# Patient Record
Sex: Female | Born: 1980 | Race: White | Hispanic: No | Marital: Married | Smoking: Never smoker
Health system: Southern US, Community
[De-identification: ages and names within clinical notes are randomized; demographics above are authoritative.]

## PROBLEM LIST (undated history)

## (undated) DIAGNOSIS — G43909 Migraine, unspecified, not intractable, without status migrainosus: Secondary | ICD-10-CM

## (undated) DIAGNOSIS — L719 Rosacea, unspecified: Secondary | ICD-10-CM

## (undated) HISTORY — DX: Migraine, unspecified, not intractable, without status migrainosus: G43.909

## (undated) HISTORY — DX: Rosacea, unspecified: L71.9

---

## 2000-03-14 ENCOUNTER — Other Ambulatory Visit: Admission: RE | Admit: 2000-03-14 | Discharge: 2000-03-14 | Payer: Self-pay | Admitting: *Deleted

## 2001-03-19 ENCOUNTER — Other Ambulatory Visit: Admission: RE | Admit: 2001-03-19 | Discharge: 2001-03-19 | Payer: Self-pay | Admitting: *Deleted

## 2002-04-10 ENCOUNTER — Other Ambulatory Visit: Admission: RE | Admit: 2002-04-10 | Discharge: 2002-04-10 | Payer: Self-pay | Admitting: Obstetrics and Gynecology

## 2003-04-23 ENCOUNTER — Other Ambulatory Visit: Admission: RE | Admit: 2003-04-23 | Discharge: 2003-04-23 | Payer: Self-pay | Admitting: Obstetrics and Gynecology

## 2004-05-03 ENCOUNTER — Other Ambulatory Visit: Admission: RE | Admit: 2004-05-03 | Discharge: 2004-05-03 | Payer: Self-pay | Admitting: Obstetrics and Gynecology

## 2005-06-23 ENCOUNTER — Other Ambulatory Visit: Admission: RE | Admit: 2005-06-23 | Discharge: 2005-06-23 | Payer: Self-pay | Admitting: Obstetrics and Gynecology

## 2005-11-28 HISTORY — PX: GALLBLADDER SURGERY: SHX652

## 2005-12-07 ENCOUNTER — Inpatient Hospital Stay (HOSPITAL_COMMUNITY): Admission: AD | Admit: 2005-12-07 | Discharge: 2005-12-07 | Payer: Self-pay | Admitting: Obstetrics and Gynecology

## 2006-05-24 ENCOUNTER — Inpatient Hospital Stay (HOSPITAL_COMMUNITY): Admission: AD | Admit: 2006-05-24 | Discharge: 2006-05-31 | Payer: Self-pay | Admitting: Obstetrics and Gynecology

## 2006-05-24 ENCOUNTER — Encounter (INDEPENDENT_AMBULATORY_CARE_PROVIDER_SITE_OTHER): Payer: Self-pay | Admitting: *Deleted

## 2006-06-21 ENCOUNTER — Emergency Department (HOSPITAL_COMMUNITY): Admission: EM | Admit: 2006-06-21 | Discharge: 2006-06-21 | Payer: Self-pay | Admitting: Emergency Medicine

## 2006-06-21 ENCOUNTER — Inpatient Hospital Stay (HOSPITAL_COMMUNITY): Admission: EM | Admit: 2006-06-21 | Discharge: 2006-06-26 | Payer: Self-pay | Admitting: *Deleted

## 2006-06-22 ENCOUNTER — Encounter (INDEPENDENT_AMBULATORY_CARE_PROVIDER_SITE_OTHER): Payer: Self-pay | Admitting: *Deleted

## 2009-06-19 ENCOUNTER — Other Ambulatory Visit: Admission: RE | Admit: 2009-06-19 | Discharge: 2009-06-19 | Payer: Self-pay | Admitting: Family Medicine

## 2011-04-15 NOTE — H&P (Signed)
NAMESEHAM, GARDENHIRE                 ACCOUNT NO.:  [id number]   MEDICAL RECORD NO.:  [id number]          PATIENT TYPE:  INP   LOCATION:  NA                            [facility]ILITY:  [facility]   PHYSICIAN:  Duke Salvia. Marcelle Overlie, M.D.DATE OF BIRTH:  1980/12/21   DATE OF ADMISSION:  DATE OF DISCHARGE:                                HISTORY & PHYSICAL   SCHEDULED DATE OF CESAREAN:  May 30, 2006   CHIEF COMPLAINT:  Breech presentation at term.   HISTORY OF PRESENT ILLNESS:  A 30 year old G1, P0 with EDD June 06, 2006,  presents with persistent breech presentation for primary cesarean section.  This procedure including risks of bleeding, infection, transfusion, adjacent  organ injury, and other risks related to bleeding, infection, phlebitis,  along with her expected recovery time discussed with her.   Blood type is O positive, rubella titer is immune, GBS was negative, 1-hour  GTT was normal.   PAST MEDICAL HISTORY:  History of migraine headache.   ALLERGIES:  1.  PENICILLIN.  2.  SULFA.   For the remainder of her family history please see her [facility] form.   PHYSICAL EXAMINATION:  VITAL SIGNS:  Temperature 98.2, blood pressure  128/80.  HEENT:  Unremarkable.  NECK:  Supple without masses.Marland Kitchen  LUNGS:  Clear.  CARDIOVASCULAR:  Regular rate and rhythm without murmurs, rubs, or gallops  noted.  BREASTS:  Not examined.  ABDOMEN:  Term fundal height, breech by Leopold's.  Fetal heart rate 140.  PELVIC:  Cervix is closed.  EXTREMITIES AND NEUROLOGIC:  Unremarkable.   IMPRESSION:  Breech presentation at term.   PLAN:  Primary cesarean section.  Procedure and risks reviewed as above.     Richard M. Marcelle Overlie, M.D.  Electronically Signed    RMH/MEDQ  D:  05/23/2006  T:  05/23/2006  Job:  [id number]

## 2011-04-15 NOTE — Op Note (Signed)
NAMEMELLISA, [name] [name]                 ACCOUNT NO.:  [id number]   MEDICAL RECORD NO.:  [id number]          PATIENT TYPE:  INP   LOCATION:  5006                         FACILITY:  [facility]   PHYSICIAN:  Sandria Bales. Ezzard Standing, M.D.  DATE OF BIRTH:  1981-09-11   DATE OF PROCEDURE:  06/22/2006  DATE OF DISCHARGE:                                 OPERATIVE REPORT   PREOPERATIVE DIAGNOSIS:  1.  Cholecystitis.  2.  Cholelithiasis.   POSTOPERATIVE DIAGNOSIS:  1.  Cholecystitis.  2.  Cholelithiasis.  3.  Probable distal common bile duct stone.  4.  Multiple intraabdominal adhesions.   PROCEDURE:  Laparoscopic cholecystectomy with intraoperative cholangiogram  and common duct manipulation with a #4 Fogarty catheter.   SURGEON:  Dr. Ovidio Kin.   FIRST ASSISTANT:  Dr. Violeta Gelinas.   ANESTHESIA:  General endotracheal with approximately 15 - 20 cc of 0.25%  Marcaine.   HISTORY:  Ms. Ludwick is a 30 year old white female who is approximately 1-  month postpartum from her first child.  She developed acute abdominal pain  and was seen by Dr. Jaclynn Guarneri earlier this morning and was admitted with  cholecystitis.  Of note, she had a c-section about a month ago, but  apparently had some kind of post op complication with infection in which  she was hospitalized for about a week but has gotten over this.  And now  there is an acute episode she has what looks like multiple gallstones on her  ultrasound.   The indications and potential complications were explained to the patient.  Potential complications include but are not limited to bleeding, infection,  bile duct injury and possibility of open surgery.   The patient now comes for attempted laparoscopic cholecystectomy.  She was  on Ancef as an antibiotic.   OPERATIVE NOTE:  Patient in the supine position with her abdomen prepped  with Betadine solution and sterilely draped.  An infraumbilical incision was  made with sharp dissection carried down  to the abdominal cavity.  A 10 mm, 0  degrees laparoscope was inserted through a 12 mm Hassan trocar.  The [facility]  trocar was secured with a 0 Vicryl suture.   I then placed a 10 mm trocar in the subxyphoid location, a 5 mm trocar on  the right mid subcostal and a 5 mm trocar at the right lateral subcostal  location.  I did an abdominal exploration.  What was remarkable was that she  had extensive intraabdominal adhesions.  I took a picture of these.  These  adhesions appeared to be located attaching to the right lobe of the liver.  They involved a lot of the right colon and small bowel on the right side and  then around her uterus.  I used scissors to kind of cut a lot of these  adhesions.  I assume this neovascular pattern on the peritoneal surface is  due to the post C section uterine infection she had.   I then turned my attention to the gallbladder which was acutely distended.  The gallbladder was decompressed.  Sharp dissection  was carried out all  along the cystic duct and the gallbladder junction.  There was a fairly  short cystic duct, probably no more than a 1-1/2 cm in length, and I  obtained an intraoperative cholangiogram.   After identified the triangle of Calot and making sure I had the cystic duct  isolated enough, I placed a cut Taut catheter inserted through the 14-gauge  gel cut into the side of the cut to the cystic duct.   The Taut catheter was secured with an Endoclip, and the intraoperative  cholangiogram showed free flow of contrast down the common bile duct.  It  showed up all the hepatic radicals which clearly showed back the areas of  obstruction distally, and it never did empty from the distal common bile  duct.  I never saw any obvious stones, however, of the duct.   We then gave the patient 1 mg of glucagon, I passed the #4 Fogarty catheter  down the cystic duct into the common bile duct.  I never thought that I was  able to pass that Fogarty catheter  into the duodenum.  I blew it up twice.  I used about a total of about 20 mL of half-strength Hypaque solution.  The  repeat cholangiogram still showed a tapering of the distal common bile duct  but no emptying into the duodenum.  There was no clear meniscus sign,  however, the common duct injection was under pressure.  So I placed an  Endoloop around the cystic duct because the cystic duct was actually fairly  broad, probably 1 cm to 1-1/2 cm broad, and I placed a clip proximal to the  Endoloop.   The gallbladder was then sharply and bluntly dissected from the gallbladder  bed.  Prior to complete division of the gall bladder from the gallbladder  bed, I revisualized the triangle of Calot.  I revisualized the gallbladder  bed.  There was no bleeding or bile leak, and I put the gallbladder in an  EndoCatch bag and delivered it through the umbilicus.   I irrigated the abdomen with about 2 L of saline, reinspected and saw, again  I had taken down a lot of these adhesions but saw no bleeding from the  adhesions.  I do think the patient will be best served with a GI consult and  possibly ERCP post op, and I think that she probably has distal common bile  duct stone.  But I did not see any obvious stone defect on our  cholangiogram.   She tolerated the procedure well.  The umbilical port was closed with a 0  Vicryl suture.  Each trocar was removed under turn, and there was no  bleeding at the trocar sites.  The skin was closed with a 5-0 Vicryl,  painted with a tincture of benzoin and Steri-Strips.  The sponge and needle  counts were correct.      Sandria Bales. Ezzard Standing, M.D.  Electronically Signed     DHN/MEDQ  D:  06/22/2006  T:  06/23/2006  Job:  [id number]   cc:   Duke Salvia. Marcelle Overlie, M.D.  Fax: [phone]   Llana Aliment. Malon Kindle., M.D.  Fax: [phone]

## 2011-04-15 NOTE — Discharge Summary (Signed)
[name] [name]                 ACCOUNT NO.:  [id number]   MEDICAL RECORD NO.:  [id number]          [name] TYPE:  INP   LOCATION:  5006                         FACILITY:  [facility]   PHYSICIAN:  Revonda Standard L. Rennis Harding, N.P. DATE OF BIRTH:  1981-01-29   DATE OF ADMISSION:  06/21/2006  DATE OF DISCHARGE:  06/26/2006                                 DISCHARGE SUMMARY   CHIEF COMPLAINT/REASON FOR ADMISSION:  [name] [name] is a 30 year old female  [name] who presented with severe epigastric pain.  She presented to the ER  and workup there revealed evidence of acute cholecystitis i.e. gallstones.  She had a white count of 3,700 and mild elevation of transaminases.  SGOT  228, SGPT 83, lipase was normal.  Common duct appeared normal on ultrasound.  The [name] was admitted with a diagnosis of acute cholecystitis.   HOSPITAL COURSE:  The [name] was admitted and taken from the emergency  department to the OR where she underwent laparoscopic cholecystectomy with  intraoperative cholangiogram.  Her preoperative diagnosis was cholecystitis  and cholelithiasis.  Her postoperative cholecystitis and cholelithiasis with  a probable distal common bile duct stone based on the intraoperative  cholangiogram.  She also was found to have multiple abdominal adhesions  which were taken down in the OR by Dr. Ezzard Standing.   Because of the finding of possible common bile duct stone on intraoperative  cholangiogram, Dr. Luxembourg was consultant.  They opted to proceed with an MRCP  initially because he was not convinced he saw common bile duct stone.  MRCP  was unrevealing.  On postoperative day #1 the [name] had significant  elevation in her transaminases with AST rising to 297, ALT to 470 and total  bilirubin up to 3.8 which previously had been normal at 1.0.  The [name]  was nauseous and had mild periumbilical tenderness at the surgical sites.   On June 24, 2006 the [name] did undergo an ERCP, no stones were seen but  a  sphincterotomy was done.  The following day, the [name] still demonstrated  mild transaminases but the enzymes were trending downward.  The [name]'s  diet was advanced and GI wanted to watch her for an additional 24 hours to  make sure her transaminases were further resolving and she had no issues  with advancement of diet.   By June 26, 2006 which is postoperative day #4 the [name] was tolerating a  regular diet.  Her AST had decreased to 70,  ALT 209 and total bilirubin is  down to 1.5.  Her abdomen was soft and benign and from a surgical standpoint  she was deemed appropriate for discharge.  At the time of dictation of  discharge summary still awaiting GI evaluation.  They will allow the [name]  to discharge home and followup with them per their recommendations.   FINAL DISCHARGE DIAGNOSES:  1.  Acute cholecystitis with cholelithiasis.  2.  ERCP without evidence of common bile duct stones, associated      sphincterotomy done.  3.  Mild cholangitis which is now resolving.   DISCHARGE MEDICATIONS:  1.  Resume any home medications.  2.  Vicodin 5/500 one to two tablets every 6 hours as needed for pain #30      dispensed with no refills.   Return to work after seen by Dr. Ezzard Standing.  Diet no restrictions.  Activity  increase activity slowly.  May shower.  No driving for 1 week, no lifting  for 2 weeks.  Allow Steri-Strips to fall off.   FOLLOWUP:  You need to call Dr. Ezzard Standing at [phone] to be seen in 2 weeks.  You need to followup with GI for further recommendations.   ADDITIONAL INSTRUCTIONS:  You need to call the doctor if fever more than  100.5 degrees by mouth, new or increased belly pain, nausea or vomiting,  redness or drainage at the wound.      Allison L. Rennis Harding, N.P.     ALE/MEDQ  D:  06/26/2006  T:  06/26/2006  Job:  [id number]   cc:   Sandria Bales. Ezzard Standing, M.D.  [address]  [place]  [place] [address]

## 2011-04-15 NOTE — Op Note (Signed)
[name] [name], [name] [name]                 ACCOUNT NO.:  [id number]   MEDICAL RECORD NO.:  [id number]          PATIENT TYPE:  INP   LOCATION:  9127                          [facility]   PHYSICIAN:  Michelle L. Grewal, M.D.DATE OF BIRTH:  03-24-1981   DATE OF PROCEDURE:  05/24/2006  DATE OF DISCHARGE:                                 OPERATIVE REPORT   PREOPERATIVE DIAGNOSES:  1.  Intrauterine pregnancy at term.  2.  Breech presentation.  3.  Labor.   POSTOPERATIVE DIAGNOSES:  1.  Intrauterine pregnancy at term.  2.  Breech presentation.  3.  Labor.  4.  Chorioamnionitis.   PROCEDURE:  Primary low transverse cesarean section.   SURGEON:  Michelle L. Vincente Poli, M.D.   ANESTHESIA:  Spinal.   FINDINGS:  Female infant, Apgar 1 at one minute and 9 at 5 minutes. Tight  nuchal cord x3.  Birth weight 6 pounds 11 ounces.  In double footling breech  presentation.  Frank pus noted in the uterus, and appendix was normal.   DISPOSITION TO PATHOLOGY:  Placenta to pathology.   DRAINS:  Foley.   ESTIMATED BLOOD LOSS:  500 cc.   COMPLICATIONS:  None.   PROCEDURE:  The patient was taken to the operating room, where a spinal was  placed by Dr. Malen Gauze. She was then prepped and draped in the usual sterile  fashion, and a Foley catheter was inserted. The spinal was tested and was  found to be adequate.  A low transverse incision was made in the skin and  carried down to the fascia.  The fascia was scored in the midline, and this  was extended laterally.  The rectus muscles were separated in the midline,  and the peritoneum was entered bluntly.  The peritoneal incision was then  stretched.  The bladder blade was inserted. The lower uterine segment  identified, and the bladder flap was created sharply and then digitally.  The bladder blade was adjusted. A low transverse incision was made in the  uterus.  The uterus was entered using a hemostat.  Amniotic fluid was clear,  but upon entry into the  uterine cavity an obvious over was emitted. The baby  was in double footling breech. As we were doing a complete breech  extraction, I noticed frank pus adherent to the baby's buttocks, consistent  with probable chorioamnionitis.  The baby was in double footling breech,  with a tight nuchal cord x3.  We did have to rotate the baby just a little  bit to deliver completely. The baby was very floppy immediately after  delivery, but after the cord was clamped and cut the baby was then taken to  the awaiting neonatal team, and Apgars were 1 at one minute and 9 at five  minutes. The uterus was then exteriorized, and the placenta was manually  removed and noted to be normal and intact.  It was sent to pathology for  analysis.  The uterine cavity with warm inside, and it was cleared of all  clots and debris.  The uterine incision was closed in one layer  using 0  chromic in a continuous running-locked stitch.  I do think the patient  clinically had chorioamnionitis. Membranes were intact at the time of  delivery. The uterus was returned to the abdomen.  Copious irrigation was  performed.  There was no other identifiable pus in the abdomen.  The  appendix was easily visualized and was noted be long, completely normal. and  not inflamed.  The incision was reinspected and noted to be normal and  hemostatic.  The peritoneum was closed using 0 Vicryl, and the rectus  muscles were reapproximated using the same 0 Vicryl.  The fascia was closed  using 0 Vicryl starting at each corner and meeting in the midline. After  irrigation of the subcutaneous layer, the skin was closed with staples.  All  sponge, lap, and instrument counts were correct x2.  The patient went to the  recovery room in stable condition.  She will be given IV clindamycin and  gentamicin postop, because I think she is at higher risk for a wound  infection because of the chorioamnionitis.      Michelle L. Vincente Poli, M.D.  Electronically  Signed     MLG/MEDQ  D:  05/24/2006  T:  05/24/2006  Job:  [id number]

## 2011-04-15 NOTE — Discharge Summary (Signed)
[name] [name], [name] [name]                 ACCOUNT NO.:  [id number]   MEDICAL RECORD NO.:  [id number]          PATIENT TYPE:  INP   LOCATION:  9127                          [facility]   PHYSICIAN:  Michelle L. Grewal, M.D.DATE OF BIRTH:  [birth year]   DATE OF ADMISSION:  05/24/2006  DATE OF DISCHARGE:  05/31/2006                                 DISCHARGE SUMMARY   ADMITTING DIAGNOSES:  1. Intrauterine pregnancy at term.  2. Breech presentation.  3. Spontaneous onset of labor.   DISCHARGE DIAGNOSES:  1. Status post low transverse cesarean section.  2. Viable female infant.  3. Severe chorioamnionitis.   PROCEDURE:  Primary low transverse cesarean section.   REASON FOR ADMISSION:  Please see written H&P.   HOSPITAL COURSE:  Patient is 24-year primigravida that was admitted to  [facility] in active labor.  Patient had a known breech  presentation and had been previously scheduled for cesarean delivery.  Due  to onset of labor with known footling breech decision was made to proceed  with a primary low transverse cesarean section.  The patient was then taken  to the operating room where spinal anesthesia was administered without  difficulty.  A low transverse incision was made with the delivery of a  viable female infant in the double footling breech presentation.  As baby  was being delivered frank pus was noted to be adherent to baby buttocks  consistent with probable chorioamnionitis.  Baby was also noted to have a  tight nuchal cord x3.  Baby was immediately very floppy at the time of  delivery but after the cord was clamped baby was taken to the waiting  neonatal team and Apgars were 1 at one minute and 9 at five minutes.  The  uterus was exteriorized and the placenta was manually removed and noted to  be normal and intact.  It was sent for pathology for analysis.  The uterine  cavity was cleared of all follow clots and debris and the incision was  closed.   Patient was started on IV clindamycin, gentamicin postoperatively  due to thought for probable chorioamnionitis.  Patient was then taken to the  recovery room in stable condition.  On postoperative day one patient was  without complaint.  She was tolerating clear liquids.  Vital signs were  stable with temperature max of 100.9.  Fundus was firm and nontender.  Abdominal dressing was clean, dry, and intact.  Laboratory findings revealed  hemoglobin of 11.2, platelet count 146,000, WBC count of 21.3.  On  postoperative day two patient was without complaint.  Vital signs were  stable.  She was afebrile for over 24 hours.  Abdomen was slightly distended  with minimal bowel sounds.  Fundus was firm __________, however, slightly  tender to palpation.  Incision was clean, dry, and intact.  Patient  continued on antibiotics and was given warm beverages and Colace.  Shortly  after midnight patient did spike temperature of 102, blood pressure 117/78,  pulse was 135.  Patient did rate pain level 7-8.  Physical was contacted.  CBC  was ordered.  IV fluid bolus was also given.  Patient was followed  closely through the night with temperature varying from 97.9-100, blood  pressure 121/82 to 129/83.  On the following morning vital signs were stable  with temperature max of 102 during the night.  However was 97.9 at the time  of rounding.  Abdomen was moderately distended, soft with minimal bowel  sounds.  Patient, however, had tolerated a regular diet.  Patient was  administered Dulcolax suppository and a CBC was ordered for the following  morning.  Later that evening patient had complained of some increase in  discomfort in the bilateral lower abdomen.  She has experienced some watery  diarrhea after the Dulcolax suppository.  Patient did have a flat plate of  the abdomen which did reveal consistent with ileus without obstruction.  Chest x-ray had noted some atelectasis.  PIH labs were within normal  limits.  Electrolytes normal.  CBC revealed hemoglobin of 11.7 and WBC count down to  14.7.  Patient continued on clindamycin and gentamicin.  Patient now was due  to ileus.  Diet was discontinued.  The patient was only given ice chips.  She was now was given a PCA pump.  On the following morning patient was  feeling somewhat better.  She was having better control of her pain with the  morphine pump.  Vital signs were stable with pulse rate down in the 90s-  100s.  She was beginning to have some diuresis.  Lungs were clear to  auscultation.  Abdomen was slightly distended, however, somewhat softer.  WBC count was 14.1.  On the following morning patient began to complain of  some pain in the lower abdomen, no nausea.  Temperature max was 101.2.  Abdomen continued to be slightly distended.  Laboratory findings revealed  hemoglobin of 11.6, WBC count of 15.5.  Electrolytes were within normal  limits.  Creatinine 1.  CT scan was ordered for the abdomen.  Later report  was called to physician which revealed no obstruction.  Patient was now  given a Fleet's enema.  On the following morning patient had had good  results from enema.  Vital signs were stable.  Temperature now afebrile.  Abdomen to be slightly distended, however, good bowel sounds.  She continued  on clear liquids at this current time and antibiotic therapy.  On the  following morning patient had had good results with the enema.  She was  having chest discomfort.  She was tolerating clear liquids and really  desired discharge.  Vital signs were stable.  She was afebrile.  Abdomen  continued to be slightly distended.  Incision was clean, dry, and intact.  She was administered a regular diet and if patient tolerated discharge  instructions reviewed and patient was later discharged home.   CONDITION ON DISCHARGE:  Stable.   DIET:  Regular, as tolerated.  ACTIVITY:  No heavy lifting, no driving x2 weeks, no vaginal entry.    FOLLOW-UP:  Patient is to follow up in the office in two to three days for  incision check and she is to call for temperature greater than 100 degrees,  persistent nausea, vomiting, heavy vaginal bleeding, and/or redness or  drainage from the incisional site.   DISCHARGE MEDICATIONS:  1. Tylox #30 one p.o. every four to six hours p.r.n.  2. Prenatal vitamins as tolerated.      Julio Sicks, N.P.      Stann Mainland. Vincente Poli, M.D.  Electronically Signed  CC/MEDQ  D:  07/19/2006  T:  07/19/2006  Job:  [id number]

## 2011-04-15 NOTE — Consult Note (Signed)
NAMEARYONA, SILL NO.:  [id number]   MEDICAL RECORD NO.:  [id number]          PATIENT TYPE:  INP   LOCATION:  5006                         FACILITY:  [facility]   PHYSICIAN:  Graylin Shiver, M.D.   DATE OF BIRTH:  20-Jan-1981   DATE OF CONSULTATION:  06/22/2006  DATE OF DISCHARGE:                                   CONSULTATION   REASON FOR CONSULTATION:  The patient is a 30 year old Caucasian female,  status post laparoscopic cholecystectomy for gallstones a few hours ago.  We  were asked to see the patient in regards to possible common bile duct stone.  The patient is postop, she is sleeping, she wakes periodically, but falls  back asleep.  Her mother is present during the interview.  She has no  specific complaints at this time.   PHYSICAL:  VITAL SIGNS:  Stable.  HEART:  Regular rhythm.  She is nonicteric.  Lungs were clear.  Abdomen is postop.   The operative cholangiogram was reviewed tonight with Dr. Bridgett Larsson from  radiology.  No obvious stone is seen in the biliary tree, but there was no  contrast which we could see enter into the duodenum.  Question would arise  as to whether this could be spasm versus a small stone which is impacted in  the ampullary area.   Yesterday her liver enzymes revealed a total bilirubin of 1, alkaline  phosphatase 107, AST 228, ALT 83.   IMPRESSION:  18.  A 30 year old female status post laparoscopic cholecystectomy.  2.  Rule out common bile duct stone.   PLAN:  At the present time I would not go directly to ERCP, in view of the  soft findings and potential risk of the procedure, which would include  bleeding, infection, perforation and pancreatitis.  I would favor  observation, rechecking her liver enzymes, and doing an MRCP to try to  further clarify this situation.           ______________________________  Graylin Shiver, M.D.     SFG/MEDQ  D:  06/22/2006  T:  06/22/2006  Job:  [id number]   cc:   Sandria Bales.  Ezzard Standing, M.D.  [address], [address]  [place]  [place] [address]

## 2011-04-15 NOTE — Op Note (Signed)
[name] [name], MEDINE                 ACCOUNT NO.:  [id number]   MEDICAL RECORD NO.:  [id number]          PATIENT TYPE:  INP   LOCATION:  5006                         FACILITY:  [facility]   PHYSICIAN:  John C. Madilyn Fireman, M.D.    DATE OF BIRTH:  07-Dec-1980   DATE OF PROCEDURE:  DATE OF DISCHARGE:                                 OPERATIVE REPORT   PROCEDURE:  Endoscopic retrograde cholangiopancreatography with  sphincterotomy.   INDICATIONS FOR PROCEDURE:  I suspect a common bile duct stone on  intraoperative cholangiogram with rise in liver function test post  operatively.   PROCEDURE:  The patient was placed in the prone position and placed on pulse  monitor with continuous low flow oxygen delivered by nasal cannula.  She was  sedated with 125 mcg IV Fentanyl and 12.5 IV Versed.  Olympus eye viewing  endoscope was advanced __________ into the lower pharynx, esophagus, and  stomach.  The pylorus was traversed and the papilla of Vater located on the  medial duodenal wall.  It had a normal appearance.  It was cannulated with  Wilson-Cook sphincterotome.  Initial deep wire cannulation and dye  injections resulted in opacification of a normal appearing pancreatic duct.  With further manipulation of the wire this common bile duct was selectively  cannulated and dye injected and revealed a slightly dilated common bile duct  with intrahepatic dilatation.  No obvious filling defect.  A large  sphincterotomy was made and 3 balloon sweeps were made with the 12 mm  balloon but no stone seen to be delivered.  At the end of the last sweep an  occlusion cholangiogram was obtained and the photograph saved and showed no  obvious filling defects.  There was what appeared to be good drainage to the  dye after the sphincterotomy.  The scope was then withdrawn and the patient  returned to the recovery in stable condition.  She tolerated the procedure  well and there were no immediate complications.   IMPRESSION:   Slightly dilated common bile duct but otherwise normal  cholangiogram.  Status post empiric sphincterotomy to cover for possibility  of residual common bile duct calculi.   PLAN:  Advance diet and recheck liver function test in the morning.           ______________________________  Everardo All. Madilyn Fireman, M.D.     JCH/MEDQ  D:  06/24/2006  T:  06/25/2006  Job:  [id number]

## 2011-04-15 NOTE — H&P (Signed)
NAMESHALAYA, SWAILES                 ACCOUNT NO.:  [id number]   MEDICAL RECORD NO.:  [id number]          PATIENT TYPE:  EMS   LOCATION:  MAJO                         FACILITY:  [facility]   PHYSICIAN:  Sharlet Salina T. Hoxworth, M.D.DATE OF BIRTH:  [birth year]   DATE OF ADMISSION:  06/21/2006  DATE OF DISCHARGE:                                HISTORY & PHYSICAL   CHIEF COMPLAINT:  Epigastric and back pain.   HISTORY OF PRESENT ILLNESS:  [name] [name] is a generally healthy 30 year old  white female who, last night, had the onset of severe epigastric pain,  pressure-like, radiating around both flanks to her back.  This was  associated with nausea and vomiting.  She was evaluated in the emergency  room early this morning and a gallbladder ultrasound showed gallstones, no  gallbladder wall thickening.  She was treated with pain and nausea  medication, improved, and was discharged home with arrangements for follow-  up.  Her pain, however, soon recurred, even more severe, again associated  with nausea and vomiting.  She now represents to the emergency room.  Her  pain has been persistent in the ER only partly relieved with medications.  She has no previous history of any similar problems prior to last night.   PAST MEDICAL HISTORY:  She is status post C-section one month ago.  No  medical or surgical illnesses or hospitalizations.   MEDICATIONS:  None.   ALLERGIES:  PENICILLIN and SULFA.   SOCIAL HISTORY:  Married.  No cigarette or alcohol use.   FAMILY HISTORY:  Negative, parents alive and well.   REVIEW OF SYSTEMS:  GENERAL:  No fever or chills.  RESPIRATORY:  No  shortness of breath, asthma, lung disease.  CARDIAC:  No chest pain,  palpitations, heart disease.  ABDOMEN:  As above.  GU: No urinary burning or  frequency.   PHYSICAL EXAMINATION:  VITAL SIGNS:  Temperature 97.6, pulse 86, respirations 20, blood pressure  120/56.  GENERAL:  She is a mildly overweight white female in no acute  stress.  SKIN:  Face is slightly flushed, warm and dry.  HEENT: No palpable mass or thyromegaly.  Sclerae nonicteric.  Nose and  oropharynx clear.  LUNGS:  Clear without wheezing or increased work of breathing.  CARDIAC:  Regular rate and rhythm, no murmurs.  No edema.  ABDOMEN: Mild epigastric tenderness.  No masses.  No organomegaly detected.  EXTREMITIES:  No deformity.  NEUROLOGIC:  Alert and oriented.  Motor and sensory exams grossly normal.   LABORATORY:  White count elevated 13.7, hemoglobin 13.2  Urinalysis  negative.  Pregnancy test negative.  LFTs show mildly elevated  transaminases, SGOT and SGPT,  228 and 83 respectively.  Alkaline  phosphatase and bilirubin normal.  Lipase 28. Ultrasound of the abdomen was  reviewed which shows gallstones, normal gallbladder wall, and common bile  duct normal.   ASSESSMENT/PLAN:  Cholelithiasis and early cholecystitis.  The patient is  being admitted for IV fluids, pain and nausea control, and will start IV  antibiotics.   PLAN:  Early laparoscopic cholecystectomy with cholangiogram.  Lorne Skeens. Hoxworth, M.D.  Electronically Signed     BTH/MEDQ  D:  06/21/2006  T:  06/21/2006  Job:  [id number]

## 2015-01-06 ENCOUNTER — Encounter: Payer: Self-pay | Admitting: Family Medicine

## 2015-01-06 ENCOUNTER — Ambulatory Visit (INDEPENDENT_AMBULATORY_CARE_PROVIDER_SITE_OTHER): Payer: 59 | Admitting: Family Medicine

## 2015-01-06 VITALS — BP 124/88 | HR 85 | Temp 98.4°F | Ht 68.25 in | Wt 183.4 lb

## 2015-01-06 DIAGNOSIS — Z0000 Encounter for general adult medical examination without abnormal findings: Secondary | ICD-10-CM

## 2015-01-06 DIAGNOSIS — G43909 Migraine, unspecified, not intractable, without status migrainosus: Secondary | ICD-10-CM | POA: Insufficient documentation

## 2015-01-06 DIAGNOSIS — G43709 Chronic migraine without aura, not intractable, without status migrainosus: Secondary | ICD-10-CM

## 2015-01-06 DIAGNOSIS — Z23 Encounter for immunization: Secondary | ICD-10-CM

## 2015-01-06 DIAGNOSIS — L719 Rosacea, unspecified: Secondary | ICD-10-CM | POA: Insufficient documentation

## 2015-01-06 NOTE — Progress Notes (Signed)
Subjective:    Patient ID: [name] [name], female    DOB: 05/14/1981, 34 y.o.   MRN: [id number]  HPI Here to establish for primary care  Lives in [place]-this is closer  Used to see Dr Wynelle LinkSun   Dr Marcelle OverlieHolland for GYN Last pap in April Has 34 year old daughter  Has an IUD -no menses    Dr Neale BurlyFreeman for headaches  (prev [facility]utch[facility]) Takes imipramine daily - 100 mg at bedtime  (for a couple of years) relpax for rescue  Also some motrin  Has migraines once per mo and tension ha a few times per week  Will have follow up next Friday   Other things tried - imitrex (pills and shots)  Has also been on elavil  Avoids caffeine  Tries to follow lifestyle guidelines  She does have trouble sleep    Dr Marian SorrowGaylia Smith for dental   Not sure when last Tdap was - in past 10 years  Wants a flu shot today   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  Past Medical History  Diagnosis Date  . Migraine    Past Surgical History  Procedure Laterality Date  . Gallbladder surgery  2007  . Cesarean section  2007   History  Substance Use Topics  . Smoking status: Never Smoker   . Smokeless tobacco: Not on file  . Alcohol Use: No   Family History  Problem Relation Age of Onset  . Cancer Maternal Grandmother    No Known Allergies No current outpatient prescriptions on file prior to visit.   No current facility-administered medications on file prior to visit.      Review of Systems Review of Systems  Constitutional: Negative for fever, appetite change, fatigue and unexpected weight change.  Eyes: Negative for pain and visual disturbance.  Respiratory: Negative for  cough and shortness of breath.   Cardiovascular: Negative for cp or palpitations    Gastrointestinal: Negative for nausea, diarrhea and constipation.  Genitourinary: Negative for urgency and frequency.  Skin: Negative for pallor and pos for flushing/ redness of face from rosacea  Neurological: Negative for weakness, light-headedness, numbness and headaches.  Hematological: Negative for adenopathy. Does not bruise/bleed easily.  Psychiatric/Behavioral: Negative for dysphoric mood. The patient is not nervous/anxious.         Objective:   Physical Exam  Constitutional: She appears well-developed and well-nourished. No distress.  overwt and well appearing   HENT:  Head: Normocephalic and atraumatic.  Right Ear: External ear normal.  Left Ear: External ear normal.  Nose: Nose normal.  Mouth/Throat: Oropharynx is clear and moist.  Eyes: Conjunctivae and EOM are normal. Pupils are equal, round, and reactive to light. Right eye exhibits no discharge. Left eye exhibits no discharge. No scleral icterus.  Neck: Normal range of motion. Neck supple. No JVD present. No thyromegaly present.  Cardiovascular: Normal rate, regular rhythm, normal heart sounds and intact distal pulses.  Exam reveals no gallop.   Pulmonary/Chest: Effort normal and breath sounds normal. No respiratory distress. She has no wheezes. She has no  rales.  Abdominal: Soft. Bowel sounds are normal. She exhibits no distension and no mass. There is no tenderness.  Musculoskeletal: She exhibits no edema or tenderness.  No acute joint changes   Lymphadenopathy:    She has no cervical adenopathy.  Neurological: She is alert. She has normal reflexes. No cranial nerve deficit. She exhibits normal muscle tone. Coordination normal.  Skin: Skin is warm and dry. No rash noted. No erythema. No pallor.  Psychiatric: She has a normal mood and affect.          Assessment & Plan:   Problem List Items Addressed This Visit       Cardiovascular and Mediastinum   Migraine    Seeing Dr Neale Burly at the [facility] Worse lately  On imipramine Plans to disc other opt for prophylaxis at appt next week Rev lifestyle change for headache       Relevant Medications   imipramine (TOFRANIL) 50 MG tablet     Musculoskeletal and Integument   Rosacea - Primary    Pt states she has tried many therapies w/o success and seen several dermatologists Disc products she uses - enc to use cetaphil to cleanse and avoid harsh detergents/scrubs and hot water Handout given        Other   Routine general medical examination at a health care facility    Reviewed health habits including diet and exercise and skin cancer prevention Reviewed appropriate screening tests for age  Also reviewed health mt list, fam hx and immunization status , as well as social and family history   Lab today for wellness Enc healthy diet and exercise       Relevant Orders   CBC with Differential/Platelet   Comprehensive metabolic panel   TSH   Lipid panel    Other Visit Diagnoses    Flu vaccine need        Relevant Orders    Flu Vaccine QUAD 36+ mos IM (Completed)

## 2015-01-06 NOTE — Progress Notes (Signed)
Pre visit review using our clinic review tool, if applicable. No additional management support is needed unless otherwise documented below in the visit note. 

## 2015-01-06 NOTE — Patient Instructions (Signed)
Labs for wellness today Flu shot today  Take care or yourself  Avoid harsh abrasive cleansers - be gentle to your skin and avoid hot water  I like cetaphil cleanser

## 2015-01-07 ENCOUNTER — Encounter: Payer: Self-pay | Admitting: Family Medicine

## 2015-01-07 LAB — COMPREHENSIVE METABOLIC PANEL
ALBUMIN: 4.4 g/dL (ref 3.5–5.2)
ALK PHOS: 70 U/L (ref 39–117)
ALT: 17 U/L (ref 0–35)
AST: 19 U/L (ref 0–37)
BUN: 13 mg/dL (ref 6–23)
CALCIUM: 9.7 mg/dL (ref 8.4–10.5)
CO2: 28 meq/L (ref 19–32)
Chloride: 103 mEq/L (ref 96–112)
Creatinine, Ser: 0.87 mg/dL (ref 0.40–1.20)
GFR: 79.38 mL/min (ref >60.00)
GLUCOSE: 93 mg/dL (ref 70–99)
POTASSIUM: 4.9 meq/L (ref 3.5–5.1)
Sodium: 137 mEq/L (ref 135–145)
Total Bilirubin: 0.5 mg/dL (ref 0.2–1.2)
Total Protein: 7.9 g/dL (ref 6.0–8.3)

## 2015-01-07 LAB — CBC WITH DIFFERENTIAL/PLATELET
BASOS ABS: 0 10*3/uL (ref 0.0–0.1)
Basophils Relative: 0.3 % (ref 0.0–3.0)
EOS ABS: 0 10*3/uL (ref 0.0–0.7)
Eosinophils Relative: 0 % (ref 0.0–5.0)
HEMATOCRIT: 40.6 % (ref 36.0–46.0)
Hemoglobin: 13.5 g/dL (ref 12.0–15.0)
LYMPHS ABS: 2.3 10*3/uL (ref 0.7–4.0)
Lymphocytes Relative: 26.3 % (ref 12.0–46.0)
MCHC: 33.2 g/dL (ref 30.0–36.0)
MCV: 86.7 fl (ref 78.0–100.0)
MONO ABS: 0.7 10*3/uL (ref 0.1–1.0)
Monocytes Relative: 8.2 % (ref 3.0–12.0)
NEUTROS PCT: 65.2 % (ref 43.0–77.0)
Neutro Abs: 5.7 10*3/uL (ref 1.4–7.7)
PLATELETS: 214 10*3/uL (ref 150.0–400.0)
RBC: 4.69 Mil/uL (ref 3.87–5.11)
RDW: 13.7 % (ref 11.5–15.5)
WBC: 8.7 10*3/uL (ref 4.0–10.5)

## 2015-01-07 LAB — LIPID PANEL
CHOL/HDL RATIO: 3
CHOLESTEROL: 132 mg/dL (ref 0–200)
HDL: 46.2 mg/dL (ref >39.00)
LDL Cholesterol: 69 mg/dL (ref 0–99)
NONHDL: 85.8
TRIGLYCERIDES: 83 mg/dL (ref 0.0–149.0)
VLDL: 16.6 mg/dL (ref 0.0–40.0)

## 2015-01-07 LAB — TSH: TSH: 7.09 u[IU]/mL — AB (ref 0.35–4.50)

## 2015-01-07 NOTE — Assessment & Plan Note (Signed)
Seeing Dr [facility]ale [facility]Freeman at the [facility] Worse lately  On imipramine Plans to disc other opt for prophylaxis at appt next week Rev lifestyle change for headache

## 2015-01-07 NOTE — Assessment & Plan Note (Signed)
Reviewed health habits including diet and exercise and skin cancer prevention Reviewed appropriate screening tests for age  Also reviewed health mt list, fam hx and immunization status , as well as social and family history   Lab today for wellness Enc healthy diet and exercise

## 2015-01-07 NOTE — Assessment & Plan Note (Signed)
Pt states she has tried many therapies w/o success and seen several dermatologists Disc products she uses - enc to use cetaphil to cleanse and avoid harsh detergents/scrubs and hot water Handout given

## 2015-01-08 ENCOUNTER — Other Ambulatory Visit (INDEPENDENT_AMBULATORY_CARE_PROVIDER_SITE_OTHER): Payer: 59

## 2015-01-08 DIAGNOSIS — R946 Abnormal results of thyroid function studies: Secondary | ICD-10-CM

## 2015-01-08 DIAGNOSIS — R7989 Other specified abnormal findings of blood chemistry: Secondary | ICD-10-CM

## 2015-01-08 LAB — T4, FREE: FREE T4: 0.78 ng/dL (ref 0.60–1.60)

## 2015-01-29 ENCOUNTER — Other Ambulatory Visit: Payer: Self-pay | Admitting: Family Medicine

## 2015-01-29 DIAGNOSIS — R7989 Other specified abnormal findings of blood chemistry: Secondary | ICD-10-CM

## 2015-02-04 ENCOUNTER — Other Ambulatory Visit (INDEPENDENT_AMBULATORY_CARE_PROVIDER_SITE_OTHER): Payer: 59

## 2015-02-04 DIAGNOSIS — R7989 Other specified abnormal findings of blood chemistry: Secondary | ICD-10-CM

## 2015-02-05 LAB — T4, FREE: FREE T4: 0.77 ng/dL (ref 0.60–1.60)

## 2015-02-05 LAB — TSH: TSH: 3.6 u[IU]/mL (ref 0.35–4.50)

## 2015-02-06 ENCOUNTER — Telehealth: Payer: Self-pay

## 2015-02-06 NOTE — Telephone Encounter (Signed)
Patient aware of lab results.

## 2015-02-06 NOTE — Telephone Encounter (Signed)
-----   Message from Judy PimpleMarne A Tower, MD sent at 02/05/2015  7:25 PM EST ----- Labs look ok  Not hypothyroid

## 2015-04-16 ENCOUNTER — Other Ambulatory Visit: Payer: Self-pay | Admitting: Obstetrics and Gynecology

## 2015-04-17 LAB — CYTOLOGY - PAP

## 2015-09-16 ENCOUNTER — Ambulatory Visit (INDEPENDENT_AMBULATORY_CARE_PROVIDER_SITE_OTHER): Payer: Commercial Managed Care - HMO

## 2015-09-16 DIAGNOSIS — Z23 Encounter for immunization: Secondary | ICD-10-CM | POA: Diagnosis not present

## 2016-02-03 ENCOUNTER — Encounter: Payer: Self-pay | Admitting: Family Medicine

## 2016-02-03 ENCOUNTER — Ambulatory Visit (INDEPENDENT_AMBULATORY_CARE_PROVIDER_SITE_OTHER): Payer: Commercial Managed Care - HMO | Admitting: Family Medicine

## 2016-02-03 VITALS — BP 126/78 | HR 82 | Temp 98.5°F | Ht 68.25 in | Wt 179.0 lb

## 2016-02-03 DIAGNOSIS — L290 Pruritus ani: Secondary | ICD-10-CM | POA: Insufficient documentation

## 2016-02-03 MED ORDER — TRIAMCINOLONE ACETONIDE 0.1 % EX CREA: 1.0000 "application " | TOPICAL_CREAM | Freq: Two times a day (BID) | CUTANEOUS | Status: DC

## 2016-02-03 NOTE — Progress Notes (Signed)
Subjective:    Patient ID: [name] [name], female    DOB: Dec 15, 1980, 35 y.o.   MRN: [id number]  HPI Here with itchiness on her "bottom" Feels really irritated  2 wk  Insides of buttocks cheeks and also around vagina Feels rough to the touch also  Changed toilet paper-no change  Also tried a flush able wipe- no help either   She has scratched due to severe itching  Has woken her up in the middle of the night   No new products or detergents  Washes with dial body wash - or other   No swelling of mouth or face or hives or sob   No vaginal discharge  No internal itching   Sits all day for job Has hx of hemorrhoids in the past and also a fissure   No rectal bleeding  Is regular (more so than usual) No blood in her stool   No abd pain   Not on OC - has IUD  Next gyn appt is May   Patient Active Problem List   Diagnosis Date Noted  . Rosacea 01/06/2015  . Routine general medical examination at a health care facility 01/06/2015  . Migraine 01/06/2015   Past Medical History  Diagnosis Date  . Migraine   . Rosacea    Past Surgical History  Procedure Laterality Date  . Gallbladder surgery  2007  . Cesarean section  2007   Social History  Substance Use Topics  . Smoking status: Never Smoker   . Smokeless tobacco: None  . Alcohol Use: No   Family History  Problem Relation Age of Onset  . Cancer Maternal Grandmother    Allergies  Allergen Reactions  . Sulfa Antibiotics    No current outpatient prescriptions on file prior to visit.   No current facility-administered medications on file prior to visit.    Review of Systems Review of Systems  Constitutional: Negative for fever, appetite change, fatigue and unexpected weight change.  Eyes: Negative for pain and visual disturbance.  Respiratory: Negative for cough and shortness of breath.   Cardiovascular: Negative for cp or palpitations    Gastrointestinal: Negative for nausea, diarrhea and constipation.    Genitourinary: Negative for urgency and frequency. neg for vaginal d/c  Skin: Negative for pallor or rash  pos for itching and redness in perineal area  Neurological: Negative for weakness, light-headedness, numbness and headaches.  Hematological: Negative for adenopathy. Does not bruise/bleed easily.  Psychiatric/Behavioral: Negative for dysphoric mood. The patient is not nervous/anxious.         Objective:   Physical Exam  Constitutional: She appears well-developed and well-nourished. No distress.  HENT:  Head: Normocephalic and atraumatic.  Eyes: Conjunctivae and EOM are normal. Pupils are equal, round, and reactive to light.  Cardiovascular: Normal rate and regular rhythm.   Abdominal: Soft. Bowel sounds are normal. She exhibits no distension and no mass. There is no tenderness. There is no rebound and no guarding.  No suprapubic tenderness or fullness    Genitourinary:  Erythema w/o skin break down (few excoriations) around anus  This extends slightly towards labia  No vaginal d/c or tenderness or swelling  No hemorrhoids noted   Musculoskeletal: She exhibits no edema.  Neurological: She is alert.  Skin: Skin is warm and dry. There is erythema. No pallor.  Psychiatric: She has a normal mood and affect.          Assessment & Plan:   Problem List Items Addressed This  Visit      Musculoskeletal and Integument   Pruritus ani - Primary    Also some itching around labia  With some erythema but not skin break down peri-anal  No yeast on vaginal wet prep   Px triamcinolone cream to calm itch- to follow after imp with barrier cream like desitin  Adv to keep area clean and dry  Disc use of fragrance free cleanser and detergent and toilet paper  Zyrtec for itching / benadryl if needed at night   Update if not starting to improve in a week or if worsening        Relevant Orders   POCT Wet Prep [facility]) (Completed)

## 2016-02-03 NOTE — Progress Notes (Signed)
Pre visit review using our clinic review tool, if applicable. No additional management support is needed unless otherwise documented below in the visit note. 

## 2016-02-03 NOTE — Patient Instructions (Signed)
Keep affected area clean and dry  Avoid harsh detergents - use dove soap for sensitive skin on the peri rectal area  For vaginal area -just flush with water Try the triamcinolone cream - tiny amount twice daily  otc zyrtec 10 mg once daily for itch   (benardryl orally as needed is ok also) Update me if not significantly improved in 1-2 weeks

## 2016-02-04 LAB — POCT WET PREP (WET MOUNT): KOH WET PREP POC: NEGATIVE

## 2016-02-04 NOTE — Assessment & Plan Note (Addendum)
Also some itching around labia  With some erythema but not skin break down peri-anal  No yeast on vaginal wet prep   Px triamcinolone cream to calm itch- to follow after imp with barrier cream like desitin  Adv to keep area clean and dry  Disc use of fragrance free cleanser and detergent and toilet paper  Zyrtec for itching / benadryl if needed at night   Update if not starting to improve in a week or if worsening

## 2016-04-29 LAB — HM PAP SMEAR: HM Pap smear: NORMAL

## 2016-07-27 ENCOUNTER — Ambulatory Visit (INDEPENDENT_AMBULATORY_CARE_PROVIDER_SITE_OTHER): Payer: Commercial Managed Care - HMO | Admitting: Family Medicine

## 2016-07-27 ENCOUNTER — Encounter: Payer: Self-pay | Admitting: Family Medicine

## 2016-07-27 VITALS — BP 128/82 | HR 70 | Temp 98.2°F | Ht 68.0 in | Wt 183.8 lb

## 2016-07-27 DIAGNOSIS — Z0000 Encounter for general adult medical examination without abnormal findings: Secondary | ICD-10-CM

## 2016-07-27 DIAGNOSIS — Z23 Encounter for immunization: Secondary | ICD-10-CM | POA: Diagnosis not present

## 2016-07-27 LAB — CBC WITH DIFFERENTIAL/PLATELET
BASOS ABS: 0 10*3/uL (ref 0.0–0.1)
BASOS PCT: 0.3 % (ref 0.0–3.0)
EOS PCT: 0.1 % (ref 0.0–5.0)
Eosinophils Absolute: 0 10*3/uL (ref 0.0–0.7)
HEMATOCRIT: 43.6 % (ref 36.0–46.0)
Hemoglobin: 14.6 g/dL (ref 12.0–15.0)
LYMPHS ABS: 2.5 10*3/uL (ref 0.7–4.0)
Lymphocytes Relative: 29.9 % (ref 12.0–46.0)
MCHC: 33.4 g/dL (ref 30.0–36.0)
MCV: 88.5 fl (ref 78.0–100.0)
MONOS PCT: 7.7 % (ref 3.0–12.0)
Monocytes Absolute: 0.6 10*3/uL (ref 0.1–1.0)
NEUTROS ABS: 5.1 10*3/uL (ref 1.4–7.7)
NEUTROS PCT: 62 % (ref 43.0–77.0)
PLATELETS: 212 10*3/uL (ref 150.0–400.0)
RBC: 4.93 Mil/uL (ref 3.87–5.11)
RDW: 13.7 % (ref 11.5–15.5)
WBC: 8.2 10*3/uL (ref 4.0–10.5)

## 2016-07-27 LAB — COMPREHENSIVE METABOLIC PANEL
ALT: 16 U/L (ref 0–35)
AST: 17 U/L (ref 0–37)
Albumin: 4.6 g/dL (ref 3.5–5.2)
Alkaline Phosphatase: 68 U/L (ref 39–117)
BUN: 11 mg/dL (ref 6–23)
CALCIUM: 9.7 mg/dL (ref 8.4–10.5)
CHLORIDE: 104 meq/L (ref 96–112)
CO2: 29 meq/L (ref 19–32)
Creatinine, Ser: 0.98 mg/dL (ref 0.40–1.20)
GFR: 68.56 mL/min (ref >60.00)
GLUCOSE: 94 mg/dL (ref 70–99)
POTASSIUM: 4.5 meq/L (ref 3.5–5.1)
Sodium: 139 mEq/L (ref 135–145)
Total Bilirubin: 0.5 mg/dL (ref 0.2–1.2)
Total Protein: 8.4 g/dL — ABNORMAL HIGH (ref 6.0–8.3)

## 2016-07-27 LAB — LIPID PANEL
Cholesterol: 132 mg/dL (ref 0–200)
HDL: 43.9 mg/dL (ref >39.00)
LDL Cholesterol: 73 mg/dL (ref 0–99)
NonHDL: 88.25
Total CHOL/HDL Ratio: 3
Triglycerides: 74 mg/dL (ref 0.0–149.0)
VLDL: 14.8 mg/dL (ref 0.0–40.0)

## 2016-07-27 LAB — TSH: TSH: 2.42 u[IU]/mL (ref 0.35–4.50)

## 2016-07-27 MED ORDER — TETANUS-DIPHTH-ACELL PERTUSSIS 5-2.5-18.5 LF-MCG/0.5 IM SUSP
0.5000 mL | Freq: Once | INTRAMUSCULAR | Status: AC
  Administered 2016-07-27: 0.5 mL via INTRAMUSCULAR

## 2016-07-27 MED ORDER — TRIAMCINOLONE ACETONIDE 0.1 % EX CREA: 1.0000 "application " | TOPICAL_CREAM | Freq: Two times a day (BID) | CUTANEOUS | 1 refills | Status: DC

## 2016-07-27 NOTE — Assessment & Plan Note (Signed)
Reviewed health habits including diet and exercise and skin cancer prevention Reviewed appropriate screening tests for age  Also reviewed health mt list, fam hx and immunization status , as well as social and family history   See HPI Flu shot today Tdap today Wellness labs today Enc starting a regular exercise routine  Also inc water/keep decreasing caffeine

## 2016-07-27 NOTE — Progress Notes (Signed)
Pre visit review using our clinic review tool, if applicable. No additional management support is needed unless otherwise documented below in the visit note. 

## 2016-07-27 NOTE — Progress Notes (Signed)
Subjective:    Patient ID: [name] [name], female    DOB: [birth year], 35 y.o.   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)  [place] Read[place] from Last 3 Encounters:  07/27/16 183 lb 12 oz (83.3 kg)  02/03/16 179 lb (81.2 kg)  01/06/15 183 lb 6.4 oz (83.2 kg)  trying to take care of herself On the go-eating habits are not the best- often has to eat late  Tries to avoid fast food when she can  Does pack food for tournaments Would like to get more exercise   In the process of moving-unpacking  Her new apt has an exercise room-plans to use it  Used to go to the gym  Walks on the track during practices   HIV screening -not interested/ not high risk    Tetanus vaccine had one over 5 years ago   Flu shot -had today   Last pap approx 6/17 - was neg pap/normal  No baseline mammogram Sees gyn   Doing ok with her headaches for the most part - did have a migraine this am  Perhaps 1-2 per month  On topamax-that helps  Works in front of a computer    Patient Active Problem List   Diagnosis Date Noted  . Pruritus ani 02/03/2016  . Rosacea 01/06/2015  . Routine general medical examination at a health care facility 01/06/2015  . Migraine 01/06/2015   Past Medical History:  Diagnosis Date  . Migraine   . Rosacea    Past Surgical History:  Procedure Laterality Date  . CESAREAN SECTION  2007  . GALLBLADDER SURGERY  2007   Social History  Substance Use Topics  . Smoking status: Never Smoker  . Smokeless tobacco: Never Used  . Alcohol use No   Family History  Problem Relation Age of Onset  . Cancer Maternal Grandmother    Allergies  Allergen Reactions  . Sulfa Antibiotics    Current Outpatient Prescriptions on File Prior to Visit  Medication Sig Dispense Refill  . eletriptan (RELPAX) 20 MG tablet Take 20 mg by mouth as needed for migraine or headache.  May repeat in 2 hours if headache persists or recurs.    . topiramate (TOPAMAX) 25 MG tablet Take 3 tablets by mouth at bedtime.     No current facility-administered medications on file prior to visit.     Review of Systems Review of Systems  Constitutional: Negative for fever, appetite change, fatigue and unexpected weight change.  Eyes: Negative for pain and visual disturbance.  Respiratory: Negative for cough and shortness of breath.   Cardiovascular: Negative for cp or palpitations    Gastrointestinal: Negative for nausea, diarrhea and constipation.  Genitourinary: Negative for urgency and frequency.  Skin: Negative for pallor or rash  pos for hx of rectal itching /chronic, pos for recent dry skin (seb derm) behind ears and in scalp occ Neurological: Negative for weakness, light-headedness, numbness and headaches.  Hematological: Negative for adenopathy. Does not bruise/bleed easily.  Psychiatric/Behavioral: Negative for dysphoric mood. The patient is not nervous/anxious.         Objective:   Physical Exam  Constitutional: She appears well-developed and well-nourished. No distress.  overwt and well appearing   HENT:  Head: Normocephalic and atraumatic.  Right Ear: External ear normal.  Left Ear: External ear normal.  Nose: Nose normal.  Mouth/Throat: Oropharynx  is clear and moist.  Eyes: Conjunctivae and EOM are normal. Pupils are equal, round, and reactive to light. Right eye exhibits no discharge. Left eye exhibits no discharge. No scleral icterus.  Neck: Normal range of motion. Neck supple. No JVD present. Carotid bruit is not present. No thyromegaly present.  Cardiovascular: Normal rate, regular rhythm, normal heart sounds and intact distal pulses.  Exam reveals no gallop.   Pulmonary/Chest: Effort normal and breath sounds normal. No respiratory distress. She has no wheezes. She has no rales.  Abdominal: Soft. Bowel sounds are normal. She exhibits no distension and no mass.  There is no tenderness.  Musculoskeletal: She exhibits no edema or tenderness.  Lymphadenopathy:    She has no cervical adenopathy.  Neurological: She is alert. She has normal reflexes. No cranial nerve deficit. She exhibits normal muscle tone. Coordination normal.  Skin: Skin is warm and dry. No rash noted. No erythema. No pallor.  Mildly tanned  Skin behind ears is dry   Some insect bites on ankles   Psychiatric: She has a normal mood and affect.          Assessment & Plan:   Problem List Items Addressed This Visit      Other   Routine general medical examination at a health care facility - Primary    Reviewed health habits including diet and exercise and skin cancer prevention Reviewed appropriate screening tests for age  Also reviewed health mt list, fam hx and immunization status , as well as social and family history   See HPI Flu shot today Tdap today Wellness labs today Enc starting a regular exercise routine  Also inc water/keep decreasing caffeine       Relevant Orders   CBC with Differential/Platelet   Comprehensive metabolic panel   TSH   Lipid panel    Other Visit Diagnoses    Need for influenza vaccination       Relevant Orders   Flu Vaccine QUAD 36+ mos IM (Completed)

## 2016-07-27 NOTE — Patient Instructions (Addendum)
Flu shot today  Tetanus shot today Tdap  Labs today Take care of yourself - try to get into a good routine of exercise and better diet

## 2016-07-28 ENCOUNTER — Encounter: Payer: Self-pay | Admitting: *Deleted

## 2017-03-09 DIAGNOSIS — G43019 Migraine without aura, intractable, without status migrainosus: Secondary | ICD-10-CM | POA: Diagnosis not present

## 2017-03-09 DIAGNOSIS — G43719 Chronic migraine without aura, intractable, without status migrainosus: Secondary | ICD-10-CM | POA: Diagnosis not present

## 2017-03-23 DIAGNOSIS — M25531 Pain in right wrist: Secondary | ICD-10-CM | POA: Diagnosis not present

## 2017-04-06 DIAGNOSIS — M25531 Pain in right wrist: Secondary | ICD-10-CM | POA: Diagnosis not present

## 2017-04-12 DIAGNOSIS — M25531 Pain in right wrist: Secondary | ICD-10-CM | POA: Diagnosis not present

## 2017-04-14 DIAGNOSIS — G5601 Carpal tunnel syndrome, right upper limb: Secondary | ICD-10-CM | POA: Diagnosis not present

## 2017-04-14 DIAGNOSIS — M25531 Pain in right wrist: Secondary | ICD-10-CM | POA: Diagnosis not present

## 2017-04-19 ENCOUNTER — Encounter: Payer: Self-pay | Admitting: Neurology

## 2017-04-20 ENCOUNTER — Other Ambulatory Visit: Payer: Self-pay | Admitting: *Deleted

## 2017-04-20 DIAGNOSIS — R200 Anesthesia of skin: Secondary | ICD-10-CM

## 2017-04-20 DIAGNOSIS — Z01419 Encounter for gynecological examination (general) (routine) without abnormal findings: Secondary | ICD-10-CM | POA: Diagnosis not present

## 2017-04-27 ENCOUNTER — Ambulatory Visit (INDEPENDENT_AMBULATORY_CARE_PROVIDER_SITE_OTHER): Payer: Commercial Managed Care - HMO | Admitting: Neurology

## 2017-04-27 DIAGNOSIS — R200 Anesthesia of skin: Secondary | ICD-10-CM

## 2017-04-27 DIAGNOSIS — G5601 Carpal tunnel syndrome, right upper limb: Secondary | ICD-10-CM

## 2017-04-27 NOTE — Procedures (Signed)
[facility]  [address], [address]  [place], [place] [address] Tel: [phone] Fax:  [phone] Test Date:  04/27/2017  Patient: Mellissa KohutGena Permer DOB: September 29, 1981 Physician: Nita Sickleonika Najae Filsaime, DO  Sex: Female Height: 5\' 9"  Ref Phys: Albertha Gheeebecca Bassett, MD  ID#: [id number] Temp: 33.3C Technician:    Patient Complaints: This is a[age] year-old female with right hand paresthesias referred for evaluation of carpal tunnel syndrome.  NCV & EMG Findings: Extensive electrodiagnostic testing of the right upper extremity shows:  1. Right mixed palmar sensory responses shows prolonged latency. Right median and ulnar sensory responses are within normal limits. 2. Right median and ulnar motor responses are within normal limits. 3. There is no evidence of active or chronic motor axon loss changes affecting any of the tested muscles. Motor unit configuration and recruitment pattern is within normal limits.  Impression: Right median neuropathy at or distal to the wrist, consistent with the clinical diagnosis of carpal tunnel syndrome. Overall, these findings are mild in degree electrically.    ___________________________ Nita Sickleonika Terren Jandreau, DO    Nerve Conduction Studies Anti Sensory Summary Table   Site NR Peak (ms) Norm Peak (ms) P-T Amp (V) Norm P-T Amp  Right Median Anti Sensory (2nd Digit)  Wrist    3.2 <3.4 62.8 >20  Right Ulnar Anti Sensory (5th Digit)  Wrist    3.0 <3.1 75.5 >12   Motor Summary Table   Site NR Onset (ms) Norm Onset (ms) O-P Amp (mV) Norm O-P Amp Site1 Site2 Delta-0 (ms) Dist (cm) Vel (m/s) Norm Vel (m/s)  Right Median Motor (Abd Poll Brev)  Wrist    3.0 <3.9 11.5 >6 Elbow Wrist 4.7 29.0 62 >50  Elbow    7.7  11.1         Right Ulnar Motor (Abd Dig Minimi)  Wrist    2.3 <3.1 15.3 >7 B Elbow Wrist 3.8 24.5 64 >50  B Elbow    6.1  14.6  A Elbow B Elbow 1.8 10.0 56 >50  A Elbow    7.9  14.5          Comparison Summary Table   Site NR Peak (ms) Norm Peak (ms) P-T  Amp (V) Site1 Site2 Delta-P (ms) Norm Delta (ms)  Right Median/Ulnar Palm Comparison (Wrist - 8cm)  Median Palm    1.8 <2.2 115.9 Median Palm Ulnar Palm 0.7   Ulnar Palm    1.1 <2.2 32.3       EMG   Side Muscle Ins Act Fibs Psw Fasc Number Recrt Dur Dur. Amp Amp. Poly Poly. Comment  Right 1stDorInt Nml Nml Nml Nml Nml Nml Nml Nml Nml Nml Nml Nml N/A  Right Abd Poll Brev Nml Nml Nml Nml Nml Nml Nml Nml Nml Nml Nml Nml N/A  Right Ext Indicis Nml Nml Nml Nml Nml Nml Nml Nml Nml Nml Nml Nml N/A  Right PronatorTeres Nml Nml Nml Nml Nml Nml Nml Nml Nml Nml Nml Nml N/A  Right Biceps Nml Nml Nml Nml Nml Nml Nml Nml Nml Nml Nml Nml N/A  Right Triceps Nml Nml Nml Nml Nml Nml Nml Nml Nml Nml Nml Nml N/A  Right Deltoid Nml Nml Nml Nml Nml Nml Nml Nml Nml Nml Nml Nml N/A      Waveforms:

## 2017-08-16 ENCOUNTER — Ambulatory Visit (INDEPENDENT_AMBULATORY_CARE_PROVIDER_SITE_OTHER): Payer: Commercial Managed Care - HMO | Admitting: Family Medicine

## 2017-08-16 ENCOUNTER — Encounter: Payer: Self-pay | Admitting: Family Medicine

## 2017-08-16 DIAGNOSIS — R05 Cough: Secondary | ICD-10-CM | POA: Insufficient documentation

## 2017-08-16 DIAGNOSIS — R059 Cough, unspecified: Secondary | ICD-10-CM

## 2017-08-16 MED ORDER — DOXYCYCLINE HYCLATE 100 MG PO TABS: 100.0000 mg | ORAL_TABLET | Freq: Two times a day (BID) | ORAL | 0 refills | Status: DC

## 2017-08-16 MED ORDER — ALBUTEROL SULFATE HFA 108 (90 BASE) MCG/ACT IN AERS: 1.0000 | INHALATION_SPRAY | Freq: Four times a day (QID) | RESPIRATORY_TRACT | 0 refills | Status: DC | PRN

## 2017-08-16 NOTE — Assessment & Plan Note (Signed)
Ctab, nontoxic, could have cough and ST from post nasal gtt/sinusitis.  D/w pt about options.  Okay to use SABA prn for cough since she had failed tessalon in the past.  Start doxy if not better soon, routine cautions d/w pt. She agrees.  Update [place] as needed.

## 2017-08-16 NOTE — Patient Instructions (Signed)
Use the inhaler for cough.  Rest and fluids.  If not better, then start doxy.  Sun caution.   Take care.  Glad to see you.

## 2017-08-16 NOTE — Progress Notes (Signed)
duration of symptoms: about 10 days.   Recent plane trip to [place] Rhinorrhea: yes Congestion: no ear pain: no sore throat: yes Cough: yes, some sputum.  Taking mucinex.  No wheeze.   Myalgias: no Sleep is disrupted from the cough.  No h/o asthma.   Chest is sore from coughing.   No vomiting, no diarrhea.    Per HPI unless specifically indicated in ROS section   Meds, vitals, and allergies reviewed.   GEN: nad, alert and oriented HEENT: mucous membranes moist, tm w/o erythema, nasal exam w/o erythema, clear discharge noted,  OP with cobblestoning, R max sinus ttp, frontal and L max sinus not ttp NECK: supple w/o LA CV: rrr.   PULM: ctab, no inc wob EXT: no edema SKIN: B rosacea rash on the face at baseline.

## 2017-08-28 DIAGNOSIS — G43719 Chronic migraine without aura, intractable, without status migrainosus: Secondary | ICD-10-CM | POA: Diagnosis not present

## 2017-08-28 DIAGNOSIS — G43019 Migraine without aura, intractable, without status migrainosus: Secondary | ICD-10-CM | POA: Diagnosis not present

## 2017-08-29 ENCOUNTER — Encounter: Payer: Self-pay | Admitting: Family Medicine

## 2017-08-29 ENCOUNTER — Ambulatory Visit (INDEPENDENT_AMBULATORY_CARE_PROVIDER_SITE_OTHER): Payer: 59 | Admitting: Family Medicine

## 2017-08-29 VITALS — BP 124/78 | HR 62 | Temp 98.0°F | Ht 68.0 in | Wt 182.2 lb

## 2017-08-29 DIAGNOSIS — Z23 Encounter for immunization: Secondary | ICD-10-CM

## 2017-08-29 DIAGNOSIS — Z0001 Encounter for general adult medical examination with abnormal findings: Secondary | ICD-10-CM

## 2017-08-29 DIAGNOSIS — R05 Cough: Secondary | ICD-10-CM

## 2017-08-29 DIAGNOSIS — Z0000 Encounter for general adult medical examination without abnormal findings: Secondary | ICD-10-CM

## 2017-08-29 DIAGNOSIS — R059 Cough, unspecified: Secondary | ICD-10-CM

## 2017-08-29 LAB — COMPREHENSIVE METABOLIC PANEL
ALBUMIN: 4.5 g/dL (ref 3.5–5.2)
ALT: 15 U/L (ref 0–35)
AST: 17 U/L (ref 0–37)
Alkaline Phosphatase: 61 U/L (ref 39–117)
BILIRUBIN TOTAL: 0.5 mg/dL (ref 0.2–1.2)
BUN: 19 mg/dL (ref 6–23)
CALCIUM: 9.7 mg/dL (ref 8.4–10.5)
CHLORIDE: 104 meq/L (ref 96–112)
CO2: 28 meq/L (ref 19–32)
Creatinine, Ser: 1 mg/dL (ref 0.40–1.20)
GFR: 66.57 mL/min (ref >60.00)
Glucose, Bld: 89 mg/dL (ref 70–99)
Potassium: 4.3 mEq/L (ref 3.5–5.1)
SODIUM: 138 meq/L (ref 135–145)
Total Protein: 8.1 g/dL (ref 6.0–8.3)

## 2017-08-29 LAB — CBC WITH DIFFERENTIAL/PLATELET
BASOS ABS: 0 10*3/uL (ref 0.0–0.1)
Basophils Relative: 0.6 % (ref 0.0–3.0)
EOS ABS: 0.1 10*3/uL (ref 0.0–0.7)
Eosinophils Relative: 1.1 % (ref 0.0–5.0)
HCT: 43 % (ref 36.0–46.0)
Hemoglobin: 14 g/dL (ref 12.0–15.0)
LYMPHS ABS: 2.1 10*3/uL (ref 0.7–4.0)
Lymphocytes Relative: 26.5 % (ref 12.0–46.0)
MCHC: 32.6 g/dL (ref 30.0–36.0)
MCV: 92.7 fl (ref 78.0–100.0)
MONO ABS: 0.5 10*3/uL (ref 0.1–1.0)
Monocytes Relative: 6.9 % (ref 3.0–12.0)
NEUTROS ABS: 5 10*3/uL (ref 1.4–7.7)
NEUTROS PCT: 64.9 % (ref 43.0–77.0)
PLATELETS: 210 10*3/uL (ref 150.0–400.0)
RBC: 4.64 Mil/uL (ref 3.87–5.11)
RDW: 13.4 % (ref 11.5–15.5)
WBC: 7.7 10*3/uL (ref 4.0–10.5)

## 2017-08-29 LAB — LIPID PANEL
CHOL/HDL RATIO: 3
CHOLESTEROL: 134 mg/dL (ref 0–200)
HDL: 45.1 mg/dL (ref >39.00)
LDL Cholesterol: 72 mg/dL (ref 0–99)
NonHDL: 88.55
TRIGLYCERIDES: 81 mg/dL (ref 0.0–149.0)
VLDL: 16.2 mg/dL (ref 0.0–40.0)

## 2017-08-29 LAB — TSH: TSH: 3.04 u[IU]/mL (ref 0.35–4.50)

## 2017-08-29 MED ORDER — BENZONATATE 200 MG PO CAPS: 200.0000 mg | ORAL_CAPSULE | Freq: Three times a day (TID) | ORAL | 1 refills | Status: DC | PRN

## 2017-08-29 NOTE — Patient Instructions (Addendum)
Try the tessalon for post viral cough  Update if not starting to improve in a week or if worsening    Flu shot today   Labs today

## 2017-08-29 NOTE — Assessment & Plan Note (Signed)
Reviewed health habits including diet and exercise and skin cancer prevention Reviewed appropriate screening tests for age  Also reviewed health mt list, fam hx and immunization status , as well as social and family history   See HPI Labs ordered  Enc exercise program

## 2017-08-29 NOTE — Progress Notes (Signed)
Subjective:    Patient ID: [name] [name], female    DOB: 1980/12/05, 36 y.o.   MRN: [id number]  HPI Here for health maintenance exam and to review chronic medical problems    Doing ok overall  Getting over a cold - almost a month  Cough just won't go away Inhaler px by Dr Para March does not help  No wheeze  delsum Feels better except for the cough  Wants to try tessalon for this   Wt Readings from Last 3 Encounters:  08/29/17 182 lb 4 oz (82.7 kg)  08/16/17 185 lb (83.9 kg)  07/27/16 183 lb 12 oz (83.3 kg)  wt is down 3 lb  She started going to a boot camp this year for exercise (lost total of 15lb-18 lb)  Eating healthy also   27.71 kg/m   Doing well overall   Flu shot - got today   Pap 6/17 Goes to obgyn dr Marcelle Overlie and pap was normal early in the year  No breast lumps on self exam or his exam     Tdap 8/17  Wellness labs due   Patient Active Problem List   Diagnosis Date Noted  . Cough 08/16/2017  . Rosacea 01/06/2015  . Routine general medical examination at a health care facility 01/06/2015  . Migraine 01/06/2015   Past Medical History:  Diagnosis Date  . Migraine   . Rosacea    Past Surgical History:  Procedure Laterality Date  . CESAREAN SECTION  2007  . GALLBLADDER SURGERY  2007   Social History  Substance Use Topics  . Smoking status: Never Smoker  . Smokeless tobacco: Never Used  . Alcohol use No   Family History  Problem Relation Age of Onset  . Cancer Maternal Grandmother    Allergies  Allergen Reactions  . Sulfa Antibiotics   . Penicillins Rash   Current Outpatient Prescriptions on File Prior to Visit  Medication Sig Dispense Refill  . albuterol (PROVENTIL HFA;VENTOLIN HFA) 108 (90 Base) MCG/ACT inhaler Inhale 1-2 puffs into the lungs every 6 (six) hours as needed (cough). 1 Inhaler 0  . eletriptan (RELPAX) 20 MG tablet Take 20 mg by mouth as needed for migraine or headache. May repeat in 2 hours if headache persists or recurs.      . triamcinolone cream (KENALOG) 0.1 % Apply 1 application topically 2 (two) times daily. Tiny amount to affected area 15 g 1   No current facility-administered medications on file prior to visit.      Review of Systems  Constitutional: Negative for activity change, appetite change, fatigue, fever and unexpected weight change.  HENT: Negative for congestion, ear pain, rhinorrhea, sinus pressure and sore throat.   Eyes: Negative for pain, redness and visual disturbance.  Respiratory: Positive for cough. Negative for shortness of breath and wheezing.   Cardiovascular: Negative for chest pain and palpitations.  Gastrointestinal: Negative for abdominal pain, blood in stool, constipation and diarrhea.  Endocrine: Negative for polydipsia and polyuria.  Genitourinary: Negative for dysuria, frequency and urgency.  Musculoskeletal: Negative for arthralgias, back pain and myalgias.  Skin: Negative for pallor and rash.  Allergic/Immunologic: Negative for environmental allergies.  Neurological: Positive for headaches. Negative for dizziness and syncope.  Hematological: Negative for adenopathy. Does not bruise/bleed easily.  Psychiatric/Behavioral: Negative for decreased concentration and dysphoric mood. The patient is not nervous/anxious.        Objective:   Physical Exam  Constitutional: She appears well-developed and well-nourished. No distress.  Well appearing  HENT:  Head: Normocephalic and atraumatic.  Right Ear: External ear normal.  Left Ear: External ear normal.  Nose: Nose normal.  Mouth/Throat: Oropharynx is clear and moist.  Eyes: Pupils are equal, round, and reactive to light. Conjunctivae and EOM are normal. Right eye exhibits no discharge. Left eye exhibits no discharge. No scleral icterus.  Neck: Normal range of motion. Neck supple. No JVD present. Carotid bruit is not present. No thyromegaly present.  Cardiovascular: Normal rate, regular rhythm, normal heart sounds and intact  distal pulses.  Exam reveals no gallop.   Pulmonary/Chest: Effort normal and breath sounds normal. No respiratory distress. She has no wheezes. She has no rales.  Good air exch No rales/rhonchi  Abdominal: Soft. Bowel sounds are normal. She exhibits no distension and no mass. There is no tenderness.  Musculoskeletal: She exhibits no edema or tenderness.  Lymphadenopathy:    She has no cervical adenopathy.  Neurological: She is alert. She has normal reflexes. No cranial nerve deficit. She exhibits normal muscle tone. Coordination normal.  Skin: Skin is warm and dry. No rash noted. No erythema. No pallor.  Flushed cheeks Nl skin tone otherwise  Psychiatric: She has a normal mood and affect.          Assessment & Plan:   Problem List Items Addressed This Visit      Other   Cough    Ongoing post viral cough syndrome  Failed bronchodilator  Trial of tessalon again - pt wants to see if it works better than in the past Disc tea/warm liquids and voice rest  Update if not starting to improve in a week or if worsening        Routine general medical examination at a health care facility - Primary    Reviewed health habits including diet and exercise and skin cancer prevention Reviewed appropriate screening tests for age  Also reviewed health mt list, fam hx and immunization status , as well as social and family history   See HPI Labs ordered  Enc exercise program        Relevant Orders   CBC with Differential/Platelet   Comprehensive metabolic panel   Lipid panel   TSH    Other Visit Diagnoses    Need for influenza vaccination       Relevant Orders   Flu Vaccine QUAD 6+ mos PF IM (Fluarix Quad PF) (Completed)

## 2017-08-29 NOTE — Assessment & Plan Note (Signed)
Ongoing post viral cough syndrome  Failed bronchodilator  Trial of tessalon again - pt wants to see if it works better than in the past Disc tea/warm liquids and voice rest  Update if not starting to improve in a week or if worsening

## 2017-11-23 DIAGNOSIS — L308 Other specified dermatitis: Secondary | ICD-10-CM | POA: Diagnosis not present

## 2017-11-23 DIAGNOSIS — L218 Other seborrheic dermatitis: Secondary | ICD-10-CM | POA: Diagnosis not present

## 2017-11-23 DIAGNOSIS — L718 Other rosacea: Secondary | ICD-10-CM | POA: Diagnosis not present

## 2017-11-30 DIAGNOSIS — G5601 Carpal tunnel syndrome, right upper limb: Secondary | ICD-10-CM | POA: Diagnosis not present

## 2018-02-07 ENCOUNTER — Telehealth: Payer: Self-pay | Admitting: Family Medicine

## 2018-02-07 MED ORDER — OSELTAMIVIR PHOSPHATE 75 MG PO CAPS: 75.0000 mg | ORAL_CAPSULE | Freq: Two times a day (BID) | ORAL | 0 refills | Status: AC

## 2018-02-07 NOTE — Telephone Encounter (Signed)
I sent it  If sob or symptoms worsen-please come in for a visit  Hope she feels better soon

## 2018-02-07 NOTE — Telephone Encounter (Signed)
Copied from CRM [id number]. Topic: Quick Communication - See Telephone Encounter >> Feb 07, 2018  9:42 AM Arlyss Gandyichardson, Liany Mumpower N, NT wrote: CRM for notification. See Telephone encounter for: Pt states that her daughter has been diagnosed with the flu and now Saffron is having symptoms of the flu; cough, fever, and wants to see if some tamiflu can be called in. [facility] [address] in [place].   02/07/18.

## 2018-02-07 NOTE — Telephone Encounter (Signed)
Pt notified Rx sent and advise to f/u if sxs worsen or she becomes SOB

## 2018-04-30 ENCOUNTER — Ambulatory Visit: Payer: 59 | Admitting: Family Medicine

## 2018-04-30 DIAGNOSIS — Z01419 Encounter for gynecological examination (general) (routine) without abnormal findings: Secondary | ICD-10-CM | POA: Diagnosis not present

## 2018-05-01 ENCOUNTER — Ambulatory Visit (INDEPENDENT_AMBULATORY_CARE_PROVIDER_SITE_OTHER)
Admission: RE | Admit: 2018-05-01 | Discharge: 2018-05-01 | Disposition: A | Discharge status: Home / Self Care | Payer: 59 | Source: Ambulatory Visit | Attending: Family Medicine | Admitting: Family Medicine

## 2018-05-01 ENCOUNTER — Ambulatory Visit: Payer: 59 | Admitting: Family Medicine

## 2018-05-01 ENCOUNTER — Encounter: Payer: Self-pay | Admitting: Family Medicine

## 2018-05-01 VITALS — BP 110/74 | HR 56 | Temp 98.1°F | Ht 68.0 in | Wt 199.0 lb

## 2018-05-01 DIAGNOSIS — G43019 Migraine without aura, intractable, without status migrainosus: Secondary | ICD-10-CM | POA: Diagnosis not present

## 2018-05-01 DIAGNOSIS — M533 Sacrococcygeal disorders, not elsewhere classified: Secondary | ICD-10-CM | POA: Insufficient documentation

## 2018-05-01 DIAGNOSIS — M545 Low back pain: Secondary | ICD-10-CM | POA: Diagnosis not present

## 2018-05-01 DIAGNOSIS — G43719 Chronic migraine without aura, intractable, without status migrainosus: Secondary | ICD-10-CM | POA: Diagnosis not present

## 2018-05-01 NOTE — Assessment & Plan Note (Signed)
Coccyx/sacrum/lower LS  2-3 mo getting worse exac by sitting Some constipation but no change from usual  No trauma   xr today of LS and sac/coccyx  nsaid otc prn  Ice/heat Donut pillow  Update and plan to follow

## 2018-05-01 NOTE — Progress Notes (Signed)
Subjective:    Patient ID: [name] [name], female    DOB: 1981/04/24, 37 y.o.   MRN: [id number]  HPI  Here for hip and back (tailbone) pain   Several months of pain -getting worse  No injury or trauma  Tail bone really hurts  Mainly when she sits (not walking)  When she squeezes gluteal muscles it hurts   Has to use her arms to stand from sitting  After a while-pain rad to external hips (with a tingly feeling also)  No radiation to lower legs or feet   Sits all day at work   No rectal pain  bms have never been very regular - some rep of pain with bms / gets constipated frequently  occ laxative No blood in her stool  No abdominal pain   Has never had back problems  She used to exercise - not for a year  Used to go to the gym and do boot camp Now she needs carpal tunnel surgery   No analgesics for this  No ice or heat  Wt Readings from Last 3 Encounters:  05/01/18 199 lb (90.3 kg)  08/29/17 182 lb 4 oz (82.7 kg)  08/16/17 185 lb (83.9 kg)   30.26 kg/m   Patient Active Problem List   Diagnosis Date Noted  . Coccyx pain 05/01/2018  . Cough 08/16/2017  . Rosacea 01/06/2015  . Routine general medical examination at a health care facility 01/06/2015  . Migraine 01/06/2015   Past Medical History:  Diagnosis Date  . Migraine   . Rosacea    Past Surgical History:  Procedure Laterality Date  . CESAREAN SECTION  2007  . GALLBLADDER SURGERY  2007   Social History   Tobacco Use  . Smoking status: Never Smoker  . Smokeless tobacco: Never Used  Substance Use Topics  . Alcohol use: No    Alcohol/week: 0.0 oz  . Drug use: No   Family History  Problem Relation Age of Onset  . Cancer Maternal Grandmother    Allergies  Allergen Reactions  . Sulfa Antibiotics   . Penicillins Rash   Current Outpatient Medications on File Prior to Visit  Medication Sig Dispense Refill  . eletriptan (RELPAX) 20 MG tablet Take 20 mg by mouth as needed for migraine or headache.  May repeat in 2 hours if headache persists or recurs.    Marland Kitchen PERPHENAZINE PO Take by mouth daily as needed.    . Topiramate (QUDEXY XR PO) Take 1 tablet by mouth at bedtime.    . triamcinolone cream (KENALOG) 0.1 % Apply 1 application topically 2 (two) times daily. Tiny amount to affected area 15 g 1   No current facility-administered medications on file prior to visit.      Review of Systems  Constitutional: Negative for activity change, appetite change, fatigue, fever and unexpected weight change.  HENT: Negative for congestion, ear pain, rhinorrhea, sinus pressure and sore throat.   Eyes: Negative for pain, redness and visual disturbance.  Respiratory: Negative for cough, shortness of breath and wheezing.   Cardiovascular: Negative for chest pain and palpitations.  Gastrointestinal: Negative for abdominal pain, blood in stool, constipation and diarrhea.  Endocrine: Negative for polydipsia and polyuria.  Genitourinary: Negative for dysuria, frequency and urgency.  Musculoskeletal: Negative for arthralgias, back pain and myalgias.       Low back/tailbone pain  Skin: Negative for pallor and rash.  Allergic/Immunologic: Negative for environmental allergies.  Neurological: Negative for dizziness, syncope and headaches.  Hematological: Negative for adenopathy. Does not bruise/bleed easily.  Psychiatric/Behavioral: Negative for decreased concentration and dysphoric mood. The patient is not nervous/anxious.        Objective:   Physical Exam  Constitutional: She appears well-developed and well-nourished. No distress.  Well appearing   Eyes: Pupils are equal, round, and reactive to light. Conjunctivae and EOM are normal.  Neck: Normal range of motion. Neck supple.  Cardiovascular: Normal rate, regular rhythm and normal heart sounds.  Pulmonary/Chest: Effort normal and breath sounds normal.  Abdominal: Soft. She exhibits no distension. There is no tenderness.  No suprapubic tenderness or  fullness    Musculoskeletal:       Lumbar back: She exhibits tenderness and bony tenderness. She exhibits normal range of motion, no edema, no deformity and no spasm.  Tender over low lumbar spine and sacrum  Nl rom of coccyx  No bruising or rash  Pain to sit from lying   Neg slr  Nl rom of hips/ankles and LS  Nl gait   Neurological: She displays no atrophy. No sensory deficit. She exhibits normal muscle tone. Gait normal.  Neg slr  Skin: Skin is warm and dry. No rash noted. She is not diaphoretic. No erythema. No pallor.  Psychiatric: She has a normal mood and affect.          Assessment & Plan:   Problem List Items Addressed This Visit      Other   Coccyx pain - Primary    Coccyx/sacrum/lower LS  2-3 mo getting worse exac by sitting Some constipation but no change from usual  No trauma   xr today of LS and sac/coccyx  nsaid otc prn  Ice/heat Donut pillow  Update and plan to follow        Relevant Orders   DG Sacrum/Coccyx   DG Lumbar Spine Complete

## 2018-05-01 NOTE — Patient Instructions (Signed)
Try some ice/heat on painful area   Sit on a round pillow if needed (donut)  If bowl movements become more problematic let me know Use a dose of mirlax daily or as often as needed to prevent constipation   Ibuprofen or naproxen over the counter may help- always take with food as directed  Pending xray reports we will make a plan

## 2018-05-02 ENCOUNTER — Telehealth: Payer: Self-pay | Admitting: Family Medicine

## 2018-05-02 DIAGNOSIS — M533 Sacrococcygeal disorders, not elsewhere classified: Secondary | ICD-10-CM

## 2018-05-02 NOTE — Telephone Encounter (Signed)
Ref done  Will route to [facility] 

## 2018-05-02 NOTE — Telephone Encounter (Signed)
-----   Message from [place], [place] sent at 05/02/2018  9:44 AM EDT ----- Pt does want to go to ortho she has been to an ortho doc in the past but she wasn't sure which one. I advise pt Dr. Milinda Antisower would pt referral in and [facility]

## 2018-05-03 NOTE — Telephone Encounter (Signed)
Appointment made to see Dr Darrelyn HillockGioffre at [facility] on 05/15/18 and patient aware of all the information-Anastasiya V Hopkins, RMA

## 2018-07-09 ENCOUNTER — Other Ambulatory Visit: Payer: Self-pay | Admitting: Family Medicine

## 2018-07-09 NOTE — Telephone Encounter (Signed)
Last filled on 07/27/16, please advise

## 2018-09-03 ENCOUNTER — Encounter: Payer: Self-pay | Admitting: *Deleted

## 2018-09-03 ENCOUNTER — Encounter: Payer: Self-pay | Admitting: Family Medicine

## 2018-09-03 ENCOUNTER — Ambulatory Visit (INDEPENDENT_AMBULATORY_CARE_PROVIDER_SITE_OTHER): Payer: 59 | Admitting: Family Medicine

## 2018-09-03 VITALS — BP 124/80 | HR 99 | Temp 98.3°F | Ht 68.25 in | Wt 200.0 lb

## 2018-09-03 DIAGNOSIS — M533 Sacrococcygeal disorders, not elsewhere classified: Secondary | ICD-10-CM

## 2018-09-03 DIAGNOSIS — Z23 Encounter for immunization: Secondary | ICD-10-CM

## 2018-09-03 DIAGNOSIS — Z0000 Encounter for general adult medical examination without abnormal findings: Secondary | ICD-10-CM

## 2018-09-03 LAB — COMPREHENSIVE METABOLIC PANEL WITH GFR
ALT: 20 U/L (ref 0–35)
AST: 20 U/L (ref 0–37)
Albumin: 4.4 g/dL (ref 3.5–5.2)
Alkaline Phosphatase: 74 U/L (ref 39–117)
BUN: 11 mg/dL (ref 6–23)
CO2: 27 meq/L (ref 19–32)
Calcium: 9.7 mg/dL (ref 8.4–10.5)
Chloride: 106 meq/L (ref 96–112)
Creatinine, Ser: 1.04 mg/dL (ref 0.40–1.20)
GFR: 63.27 mL/min
Glucose, Bld: 103 mg/dL — ABNORMAL HIGH (ref 70–99)
Potassium: 4 meq/L (ref 3.5–5.1)
Sodium: 138 meq/L (ref 135–145)
Total Bilirubin: 0.5 mg/dL (ref 0.2–1.2)
Total Protein: 7.7 g/dL (ref 6.0–8.3)

## 2018-09-03 LAB — LIPID PANEL
CHOLESTEROL: 134 mg/dL (ref 0–200)
HDL: 43.1 mg/dL (ref >39.00)
LDL CALC: 70 mg/dL (ref 0–99)
NonHDL: 90.99
TRIGLYCERIDES: 107 mg/dL (ref 0.0–149.0)
Total CHOL/HDL Ratio: 3
VLDL: 21.4 mg/dL (ref 0.0–40.0)

## 2018-09-03 LAB — CBC WITH DIFFERENTIAL/PLATELET
Basophils Absolute: 0 K/uL (ref 0.0–0.1)
Basophils Relative: 0.3 % (ref 0.0–3.0)
Eosinophils Absolute: 0.1 K/uL (ref 0.0–0.7)
Eosinophils Relative: 1.1 % (ref 0.0–5.0)
HCT: 41.3 % (ref 36.0–46.0)
Hemoglobin: 13.8 g/dL (ref 12.0–15.0)
Lymphocytes Relative: 18.9 % (ref 12.0–46.0)
Lymphs Abs: 1.6 K/uL (ref 0.7–4.0)
MCHC: 33.4 g/dL (ref 30.0–36.0)
MCV: 89.2 fl (ref 78.0–100.0)
Monocytes Absolute: 0.7 K/uL (ref 0.1–1.0)
Monocytes Relative: 8.6 % (ref 3.0–12.0)
Neutro Abs: 5.9 K/uL (ref 1.4–7.7)
Neutrophils Relative %: 71.1 % (ref 43.0–77.0)
Platelets: 213 K/uL (ref 150.0–400.0)
RBC: 4.63 Mil/uL (ref 3.87–5.11)
RDW: 12.7 % (ref 11.5–15.5)
WBC: 8.3 K/uL (ref 4.0–10.5)

## 2018-09-03 LAB — TSH: TSH: 2.61 u[IU]/mL (ref 0.35–4.50)

## 2018-09-03 NOTE — Assessment & Plan Note (Signed)
Reviewed health habits including diet and exercise and skin cancer prevention Reviewed appropriate screening tests for age  Also reviewed health mt list, fam hx and immunization status , as well as social and family history   See HPI Labs ordered for wellness Fu shot today  Will send for pap report from gyn  Disc sun protection  Enc her to get back on track with exercise (plans to address coccyx pain with orthopedics as well)

## 2018-09-03 NOTE — Patient Instructions (Addendum)
Start walking when you can - be as active as you can - get creative   Eat as healthy as you can   Labs today  Flu shot today   Take care of yourself   We will re refer you to orthopedics

## 2018-09-03 NOTE — Assessment & Plan Note (Signed)
Pt cancelled prev orthopedic appt-got briefly better Now symptoms are back and causing more problems with exercise/etc Ref done  Continue to use donut pillow/cold compress prn

## 2018-09-03 NOTE — Progress Notes (Signed)
Subjective:    Patient ID: [name] [name], female    DOB: Oct 05, 1981, 37 y.o.   MRN: [id number]  HPI Here for health maintenance exam and to review chronic medical problems    Had a hard year  "my body is falling apart"  Thinks she takes care of yourself  Needs to have hand surgery = cannot afford (she is trying to plan for it)    Wt Readings from Last 3 Encounters:  09/03/18 200 lb (90.7 kg)  05/01/18 199 lb (90.3 kg)  08/29/17 182 lb 4 oz (82.7 kg)  time is an issue for self care  Not exercising - used to  (cannot lift weights - due to severe carpal tunnel ) Needs to start walking (now that is is getting cooler)  Gained her weight back  30.19 kg/m   Flu vaccine = given today  Pap this summer-nl  Dr Marcelle Overlie  Has IUD -- spotting now and then but no periods  No plans to get pregnant   Tetanus shot 8/17  Due for wellness labs  Not eating more high cholesterol foods   Fasting for labs  Headaches are under control- sees neurology   Patient Active Problem List   Diagnosis Date Noted  . Coccyx pain 05/01/2018  . Rosacea 01/06/2015  . Routine general medical examination at a health care facility 01/06/2015  . Migraine 01/06/2015   Past Medical History:  Diagnosis Date  . Migraine   . Rosacea    Past Surgical History:  Procedure Laterality Date  . CESAREAN SECTION  2007  . GALLBLADDER SURGERY  2007   Social History   Tobacco Use  . Smoking status: Never Smoker  . Smokeless tobacco: Never Used  Substance Use Topics  . Alcohol use: No    Alcohol/week: 0.0 standard drinks  . Drug use: No   Family History  Problem Relation Age of Onset  . Cancer Maternal Grandmother    Allergies  Allergen Reactions  . Sulfa Antibiotics   . Penicillins Rash   Current Outpatient Medications on File Prior to Visit  Medication Sig Dispense Refill  . eletriptan (RELPAX) 20 MG tablet Take 20 mg by mouth as needed for migraine or headache. May repeat in 2 hours if headache  persists or recurs.    Marland Kitchen PERPHENAZINE PO Take by mouth daily as needed.    . Topiramate (QUDEXY XR PO) Take 1 tablet by mouth at bedtime.    . triamcinolone cream (KENALOG) 0.1 % APPLY A TINY AMOUNT TO AFFECTED AREA TOPICALLY TWICE A DAY 15 g 0   No current facility-administered medications on file prior to visit.     Review of Systems  Constitutional: Negative for activity change, appetite change, fatigue, fever and unexpected weight change.  HENT: Negative for congestion, ear pain, rhinorrhea, sinus pressure and sore throat.   Eyes: Negative for pain, redness and visual disturbance.  Respiratory: Negative for cough, shortness of breath and wheezing.   Cardiovascular: Negative for chest pain and palpitations.  Gastrointestinal: Negative for abdominal pain, blood in stool, constipation and diarrhea.  Endocrine: Negative for polydipsia and polyuria.  Genitourinary: Negative for dysuria, frequency and urgency.  Musculoskeletal: Negative for arthralgias, back pain and myalgias.       Acute on chronic tailbone pain  Skin: Negative for pallor and rash.  Allergic/Immunologic: Negative for environmental allergies.  Neurological: Negative for dizziness, syncope and headaches.  Hematological: Negative for adenopathy. Does not bruise/bleed easily.  Psychiatric/Behavioral: Negative for decreased concentration and  dysphoric mood. The patient is not nervous/anxious.        Objective:   Physical Exam  Constitutional: She appears well-developed and well-nourished. No distress.  obese and well appearing   HENT:  Head: Normocephalic and atraumatic.  Right Ear: External ear normal.  Left Ear: External ear normal.  Nose: Nose normal.  Mouth/Throat: Oropharynx is clear and moist.  Eyes: Pupils are equal, round, and reactive to light. Conjunctivae and EOM are normal. Right eye exhibits no discharge. Left eye exhibits no discharge. No scleral icterus.  Neck: Normal range of motion. Neck supple. No JVD  present. Carotid bruit is not present. No thyromegaly present.  Cardiovascular: Normal rate, regular rhythm, normal heart sounds and intact distal pulses. Exam reveals no gallop.  Pulmonary/Chest: Effort normal and breath sounds normal. No respiratory distress. She has no wheezes. She has no rales.  Good air exch  Abdominal: Soft. Bowel sounds are normal. She exhibits no distension and no mass. There is no tenderness.  Genitourinary:  Genitourinary Comments: Breast and pelvic exam done by gyn provider  Musculoskeletal: She exhibits no edema or tenderness.  Lymphadenopathy:    She has no cervical adenopathy.  Neurological: She is alert. She has normal reflexes. She displays normal reflexes. No cranial nerve deficit. She exhibits normal muscle tone. Coordination normal.  Skin: Skin is warm and dry. No rash noted. No erythema. No pallor.  Mildly tanned Some facial flushing/rosacea Few solar lentigines  Psychiatric: She has a normal mood and affect. Her mood appears not anxious. She does not exhibit a depressed mood.  Pleasant           Assessment & Plan:   Problem List Items Addressed This Visit      Other   Coccyx pain    Pt cancelled prev orthopedic appt-got briefly better Now symptoms are back and causing more problems with exercise/etc Ref done  Continue to use donut pillow/cold compress prn      Relevant Orders   Ambulatory referral to Orthopedic Surgery   Routine general medical examination at a health care facility - Primary    Reviewed health habits including diet and exercise and skin cancer prevention Reviewed appropriate screening tests for age  Also reviewed health mt list, fam hx and immunization status , as well as social and family history   See HPI Labs ordered for wellness Fu shot today  Will send for pap report from gyn  Disc sun protection  Enc her to get back on track with exercise (plans to address coccyx pain with orthopedics as well)       Relevant  Orders   CBC with Differential/Platelet   Comprehensive metabolic panel   Lipid panel   TSH    Other Visit Diagnoses    Need for influenza vaccination       Relevant Orders   Flu Vaccine QUAD 6+ mos PF IM (Fluarix Quad PF) (Completed)

## 2018-09-11 DIAGNOSIS — M533 Sacrococcygeal disorders, not elsewhere classified: Secondary | ICD-10-CM | POA: Diagnosis not present

## 2018-11-02 DIAGNOSIS — G43019 Migraine without aura, intractable, without status migrainosus: Secondary | ICD-10-CM | POA: Diagnosis not present

## 2018-11-02 DIAGNOSIS — G43719 Chronic migraine without aura, intractable, without status migrainosus: Secondary | ICD-10-CM | POA: Diagnosis not present

## 2018-12-20 DIAGNOSIS — G5601 Carpal tunnel syndrome, right upper limb: Secondary | ICD-10-CM | POA: Diagnosis not present

## 2018-12-31 DIAGNOSIS — G5601 Carpal tunnel syndrome, right upper limb: Secondary | ICD-10-CM | POA: Diagnosis not present

## 2019-01-28 DIAGNOSIS — M25531 Pain in right wrist: Secondary | ICD-10-CM | POA: Diagnosis not present

## 2019-01-28 DIAGNOSIS — M25631 Stiffness of right wrist, not elsewhere classified: Secondary | ICD-10-CM | POA: Diagnosis not present

## 2019-01-28 DIAGNOSIS — G5601 Carpal tunnel syndrome, right upper limb: Secondary | ICD-10-CM | POA: Diagnosis not present

## 2019-01-30 DIAGNOSIS — M25631 Stiffness of right wrist, not elsewhere classified: Secondary | ICD-10-CM | POA: Diagnosis not present

## 2019-01-30 DIAGNOSIS — G5601 Carpal tunnel syndrome, right upper limb: Secondary | ICD-10-CM | POA: Diagnosis not present

## 2019-01-30 DIAGNOSIS — M25531 Pain in right wrist: Secondary | ICD-10-CM | POA: Diagnosis not present

## 2019-02-04 DIAGNOSIS — G5601 Carpal tunnel syndrome, right upper limb: Secondary | ICD-10-CM | POA: Diagnosis not present

## 2019-02-04 DIAGNOSIS — M25631 Stiffness of right wrist, not elsewhere classified: Secondary | ICD-10-CM | POA: Diagnosis not present

## 2019-02-04 DIAGNOSIS — M25531 Pain in right wrist: Secondary | ICD-10-CM | POA: Diagnosis not present

## 2019-02-06 DIAGNOSIS — G5601 Carpal tunnel syndrome, right upper limb: Secondary | ICD-10-CM | POA: Diagnosis not present

## 2019-02-06 DIAGNOSIS — M25531 Pain in right wrist: Secondary | ICD-10-CM | POA: Diagnosis not present

## 2019-02-06 DIAGNOSIS — M25631 Stiffness of right wrist, not elsewhere classified: Secondary | ICD-10-CM | POA: Diagnosis not present

## 2019-02-15 ENCOUNTER — Other Ambulatory Visit: Payer: Self-pay | Admitting: Family Medicine

## 2019-09-06 ENCOUNTER — Ambulatory Visit (INDEPENDENT_AMBULATORY_CARE_PROVIDER_SITE_OTHER): Payer: 59 | Admitting: Family Medicine

## 2019-09-06 ENCOUNTER — Other Ambulatory Visit: Payer: Self-pay

## 2019-09-06 ENCOUNTER — Encounter: Payer: Self-pay | Admitting: Family Medicine

## 2019-09-06 VITALS — BP 108/72 | HR 57 | Temp 97.7°F | Ht 68.5 in | Wt 194.0 lb

## 2019-09-06 DIAGNOSIS — Z0000 Encounter for general adult medical examination without abnormal findings: Secondary | ICD-10-CM | POA: Diagnosis not present

## 2019-09-06 DIAGNOSIS — G43709 Chronic migraine without aura, not intractable, without status migrainosus: Secondary | ICD-10-CM | POA: Diagnosis not present

## 2019-09-06 DIAGNOSIS — Z23 Encounter for immunization: Secondary | ICD-10-CM | POA: Diagnosis not present

## 2019-09-06 LAB — CBC WITH DIFFERENTIAL/PLATELET
Basophils Absolute: 0.1 10*3/uL (ref 0.0–0.1)
Basophils Relative: 0.7 % (ref 0.0–3.0)
Eosinophils Absolute: 0.2 10*3/uL (ref 0.0–0.7)
Eosinophils Relative: 1.8 % (ref 0.0–5.0)
HCT: 42.4 % (ref 36.0–46.0)
Hemoglobin: 14 g/dL (ref 12.0–15.0)
Lymphocytes Relative: 22.3 % (ref 12.0–46.0)
Lymphs Abs: 1.9 10*3/uL (ref 0.7–4.0)
MCHC: 33 g/dL (ref 30.0–36.0)
MCV: 90.7 fl (ref 78.0–100.0)
Monocytes Absolute: 0.7 10*3/uL (ref 0.1–1.0)
Monocytes Relative: 8 % (ref 3.0–12.0)
Neutro Abs: 5.7 10*3/uL (ref 1.4–7.7)
Neutrophils Relative %: 67.2 % (ref 43.0–77.0)
Platelets: 196 10*3/uL (ref 150.0–400.0)
RBC: 4.67 Mil/uL (ref 3.87–5.11)
RDW: 14 % (ref 11.5–15.5)
WBC: 8.4 10*3/uL (ref 4.0–10.5)

## 2019-09-06 LAB — COMPREHENSIVE METABOLIC PANEL
ALT: 22 U/L (ref 0–35)
AST: 18 U/L (ref 0–37)
Albumin: 4.6 g/dL (ref 3.5–5.2)
Alkaline Phosphatase: 80 U/L (ref 39–117)
BUN: 15 mg/dL (ref 6–23)
CO2: 25 mEq/L (ref 19–32)
Calcium: 9.7 mg/dL (ref 8.4–10.5)
Chloride: 105 mEq/L (ref 96–112)
Creatinine, Ser: 1.04 mg/dL (ref 0.40–1.20)
GFR: 59.2 mL/min — ABNORMAL LOW (ref >60.00)
Glucose, Bld: 95 mg/dL (ref 70–99)
Potassium: 3.9 mEq/L (ref 3.5–5.1)
Sodium: 139 mEq/L (ref 135–145)
Total Bilirubin: 0.7 mg/dL (ref 0.2–1.2)
Total Protein: 7.9 g/dL (ref 6.0–8.3)

## 2019-09-06 LAB — LIPID PANEL
Cholesterol: 147 mg/dL (ref 0–200)
HDL: 47.1 mg/dL (ref >39.00)
LDL Cholesterol: 84 mg/dL (ref 0–99)
NonHDL: 100.38
Total CHOL/HDL Ratio: 3
Triglycerides: 82 mg/dL (ref 0.0–149.0)
VLDL: 16.4 mg/dL (ref 0.0–40.0)

## 2019-09-06 LAB — TSH: TSH: 6.02 u[IU]/mL — ABNORMAL HIGH (ref 0.35–4.50)

## 2019-09-06 NOTE — Assessment & Plan Note (Signed)
Goes to the headache center Fairly controlled with topiramate  triptan for rescue  Good habits Drinking more water and less caffeine

## 2019-09-06 NOTE — Patient Instructions (Addendum)
Try to fit in some exercise daily -walking is great   Flu shot today  Labs today   Make sure to drink enough water  Less soda and other drinks   Take care of yourself

## 2019-09-06 NOTE — Progress Notes (Signed)
Subjective:    Patient ID: [name] [name], female    DOB: Mar 02, 1981, 38 y.o.   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   Wt Readings from Last 3 Encounters:  09/06/19 194 lb (88 kg)  09/03/18 200 lb (90.7 kg)  05/01/18 199 lb (90.3 kg)  wt is down 6 lb  Has been watching what she eats  Not exercising much-she tries to walk  29.07 kg/m   Flu vaccine given today  Pap 6/19 at gyn Neg with neg HPV Had exam 6/20 w/o pap  Has IUD -working well occ spotting/no period for the most part    Tdap 8/17   Due for labs Lab Results  Component Value Date   CHOL 134 09/03/2018   HDL 43.10 09/03/2018   LDLCALC 70 09/03/2018   TRIG 107.0 09/03/2018   CHOLHDL 3 09/03/2018   H/o migraine  Headache clinic topiramate and perphenazpne/relpax prn   Fairly well under control  Has to stay on her medication    Patient Active Problem List   Diagnosis Date Noted  . Coccyx pain 05/01/2018  . Rosacea 01/06/2015  . Routine general medical examination at a health care facility 01/06/2015  . Migraine 01/06/2015   Past Medical History:  Diagnosis Date  . Migraine   . Rosacea    Past Surgical History:  Procedure Laterality Date  . CESAREAN SECTION  2007  . GALLBLADDER SURGERY  2007   Social History   Tobacco Use  . Smoking status: Never Smoker  . Smokeless tobacco: Never Used  Substance Use Topics  . Alcohol use: No    Alcohol/week: 0.0 standard drinks  . Drug use: No   Family History  Problem Relation Age of Onset  . Cancer Maternal Grandmother    Allergies  Allergen Reactions  . Sulfa Antibiotics   . Penicillins Rash   Current Outpatient Medications on File Prior to Visit  Medication Sig Dispense Refill  . eletriptan (RELPAX) 20 MG tablet Take 20 mg by mouth as needed for migraine or headache. May repeat in 2 hours if headache persists or  recurs.    Marland Kitchen PERPHENAZINE PO Take by mouth daily as needed.    . Topiramate (QUDEXY XR PO) Take 1 tablet by mouth at bedtime.    . triamcinolone cream (KENALOG) 0.1 % APPLY A TINY AMOUNT TO AFFECTED AREA TOPICALLY TWICE DAILY 30 g 0   No current facility-administered medications on file prior to visit.     Review of Systems  Constitutional: Negative for activity change, appetite change, fatigue, fever and unexpected weight change.  HENT: Negative for congestion, ear pain, rhinorrhea, sinus pressure and sore throat.   Eyes: Negative for pain, redness and visual disturbance.  Respiratory: Negative for cough, shortness of breath and wheezing.   Cardiovascular: Negative for chest pain and palpitations.  Gastrointestinal: Negative for abdominal pain, blood in stool, constipation and diarrhea.  Endocrine: Negative for polydipsia and polyuria.  Genitourinary: Negative for dysuria, frequency and urgency.  Musculoskeletal: Negative for arthralgias, back pain and myalgias.  Skin: Negative for pallor and rash.  Allergic/Immunologic: Negative for environmental allergies.  Neurological: Positive for headaches. Negative for dizziness and syncope.  Hematological: Negative for adenopathy. Does not bruise/bleed easily.  Psychiatric/Behavioral: Negative for decreased concentration and dysphoric mood. The patient is not nervous/anxious.        Objective:  Physical Exam Constitutional:      General: She is not in acute distress.    Appearance: Normal appearance. She is well-developed. She is not ill-appearing or diaphoretic.     Comments: Overweight   HENT:     Head: Normocephalic and atraumatic.     Right Ear: Tympanic membrane, ear canal and external ear normal.     Left Ear: Tympanic membrane, ear canal and external ear normal.     Nose: Nose normal. No congestion.     Mouth/Throat:     Mouth: Mucous membranes are moist.     Pharynx: Oropharynx is clear. No posterior oropharyngeal erythema.   Eyes:     General: No scleral icterus.    Extraocular Movements: Extraocular movements intact.     Conjunctiva/sclera: Conjunctivae normal.     Pupils: Pupils are equal, round, and reactive to light.  Neck:     Musculoskeletal: Normal range of motion and neck supple. No neck rigidity or muscular tenderness.     Thyroid: No thyromegaly.     Vascular: No carotid bruit or JVD.  Cardiovascular:     Rate and Rhythm: Normal rate and regular rhythm.     Pulses: Normal pulses.     Heart sounds: Normal heart sounds. No gallop.   Pulmonary:     Effort: Pulmonary effort is normal. No respiratory distress.     Breath sounds: Normal breath sounds. No wheezing.     Comments: Good air exch Chest:     Chest wall: No tenderness.  Abdominal:     General: Bowel sounds are normal. There is no distension or abdominal bruit.     Palpations: Abdomen is soft. There is no mass.     Tenderness: There is no abdominal tenderness.     Hernia: No hernia is present.  Genitourinary:    Comments: Gyn does breast and pelvic exam Musculoskeletal: Normal range of motion.        General: No tenderness.     Right lower leg: No edema.     Left lower leg: No edema.  Lymphadenopathy:     Cervical: No cervical adenopathy.  Skin:    General: Skin is warm and dry.     Coloration: Skin is not pale.     Findings: No erythema or rash.     Comments: Few lentigines  Neurological:     Mental Status: She is alert. Mental status is at baseline.     Cranial Nerves: No cranial nerve deficit.     Motor: No abnormal muscle tone.     Coordination: Coordination normal.     Gait: Gait normal.     Deep Tendon Reflexes: Reflexes are normal and symmetric. Reflexes normal.  Psychiatric:        Mood and Affect: Mood normal.        Cognition and Memory: Cognition and memory normal.           Assessment & Plan:   Problem List Items Addressed This Visit      Cardiovascular and Mediastinum   Migraine    Goes to the  headache center Fairly controlled with topiramate  triptan for rescue  Good habits Drinking more water and less caffeine         Other   Routine general medical examination at a health care facility - Primary    Reviewed health habits including diet and exercise and skin cancer prevention Reviewed appropriate screening tests for age  Also reviewed health mt list, fam hx  and immunization status , as well as social and family history   See HPI Labs for wellness ordered Enc good diet/exercise and water intake Flu vaccine today  Sees gyn for her female exam and is utd pap Plans to start mammograms at age 38       Relevant Orders   CBC with Differential/Platelet   Comprehensive metabolic panel   Lipid panel   TSH    Other Visit Diagnoses    Need for influenza vaccination       Relevant Orders   Flu Vaccine QUAD 6+ mos PF IM (Fluarix Quad PF) (Completed)

## 2019-09-06 NOTE — Assessment & Plan Note (Addendum)
Reviewed health habits including diet and exercise and skin cancer prevention Reviewed appropriate screening tests for age  Also reviewed health mt list, fam hx and immunization status , as well as social and family history   See HPI Labs for wellness ordered Enc good diet/exercise and water intake Flu vaccine today  Sees gyn for her female exam and is utd pap Plans to start mammograms at age 38

## 2019-09-30 IMAGING — DX DG LUMBAR SPINE COMPLETE 4+V
5 series · 5 of 5 positions shown · non-contrast
Comparison: Lumbosacral radiographs-earlier same day; CT abdomen
pelvis-05/29/2006

CLINICAL DATA: Pain involving the tailbone for the past several
months, worse while sitting. No known trauma.

EXAM:
LUMBAR SPINE - COMPLETE 4+ VIEW

[l-spine ap]
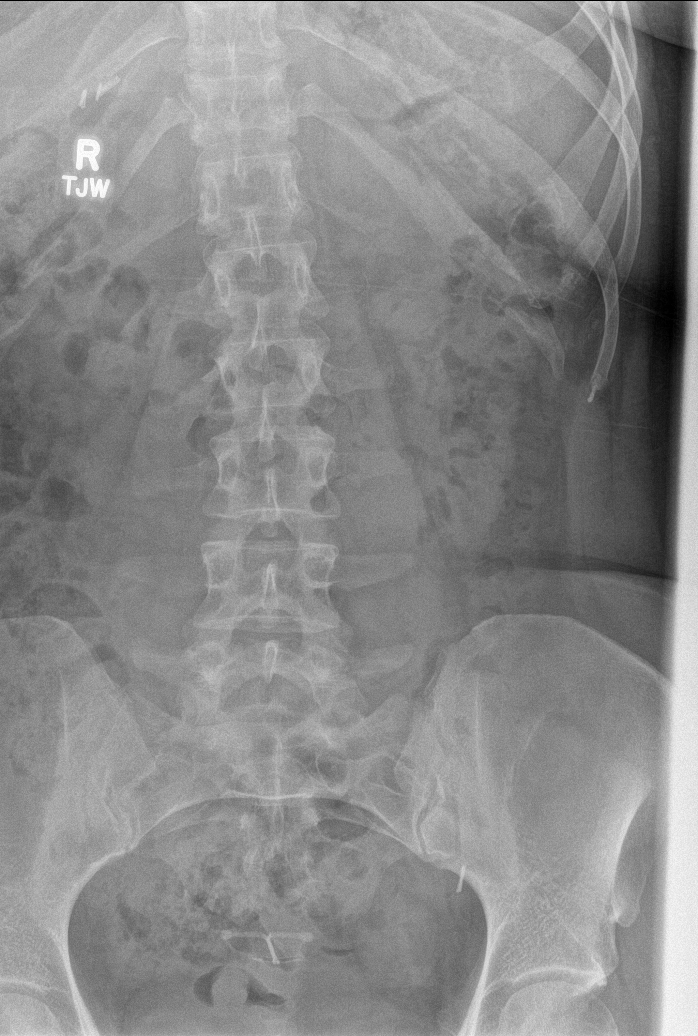

[l-spine obl (1 of 2)]
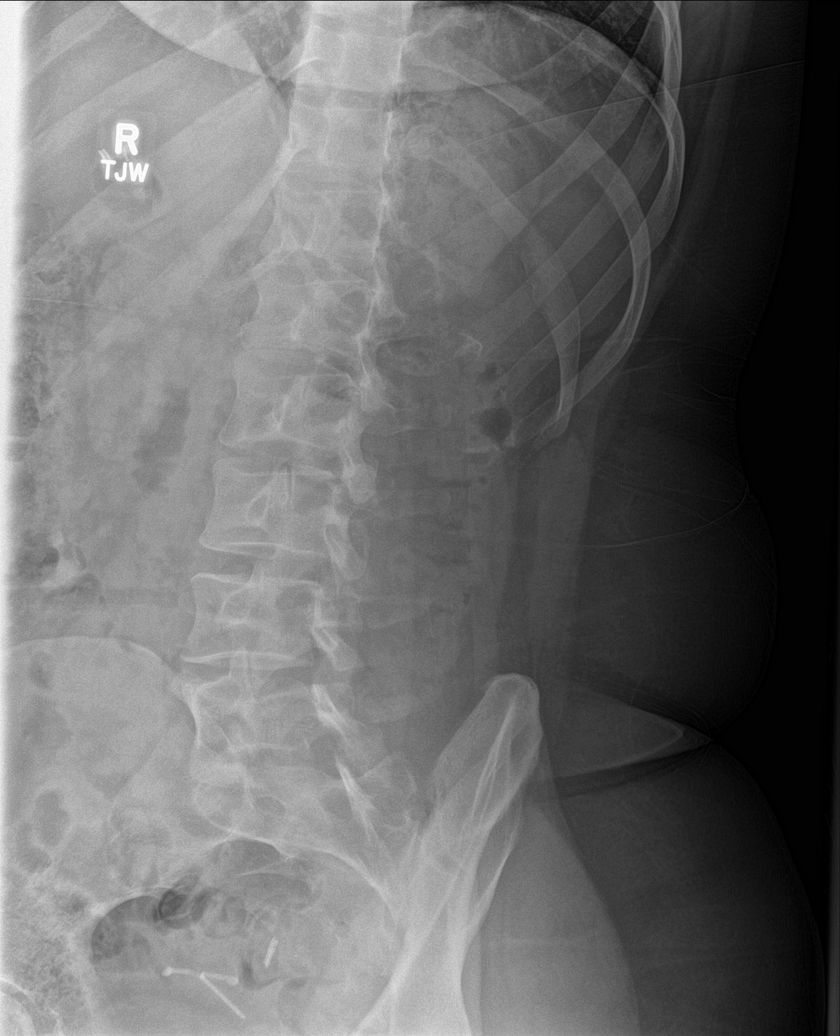

[l-spine obl (2 of 2)]
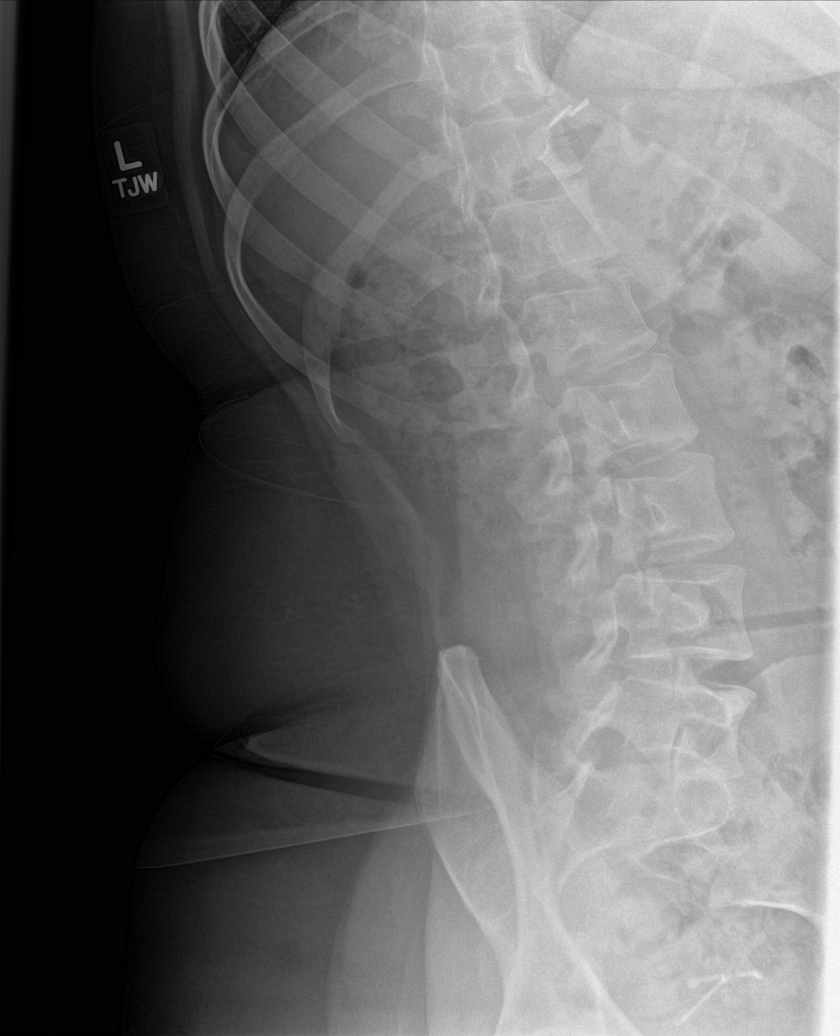

[l-spine lat]
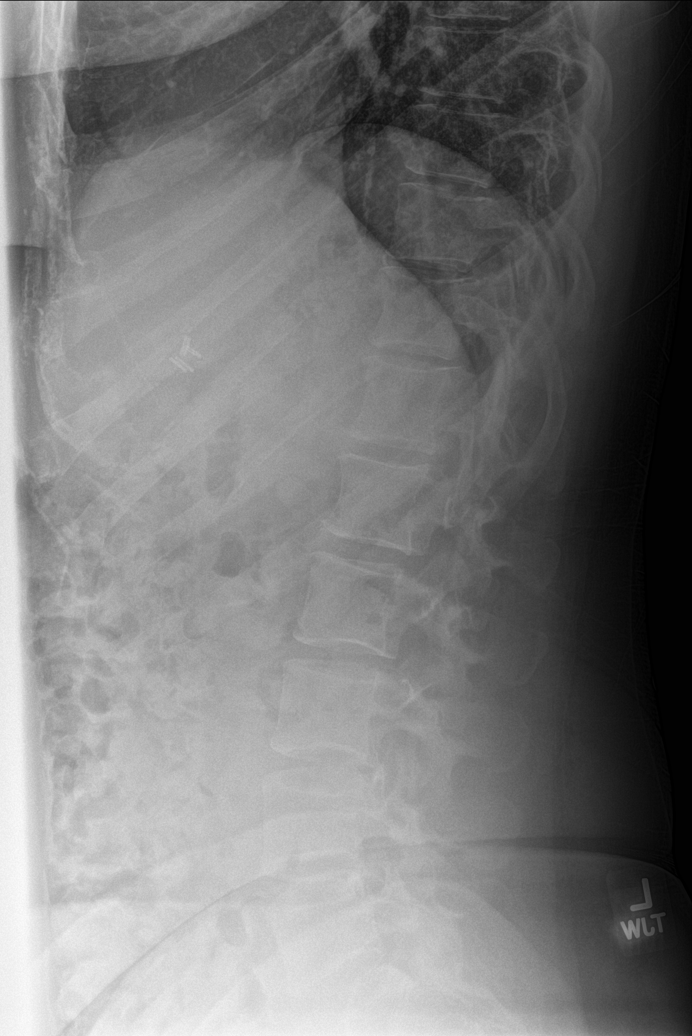

[l-spine l5/s1]
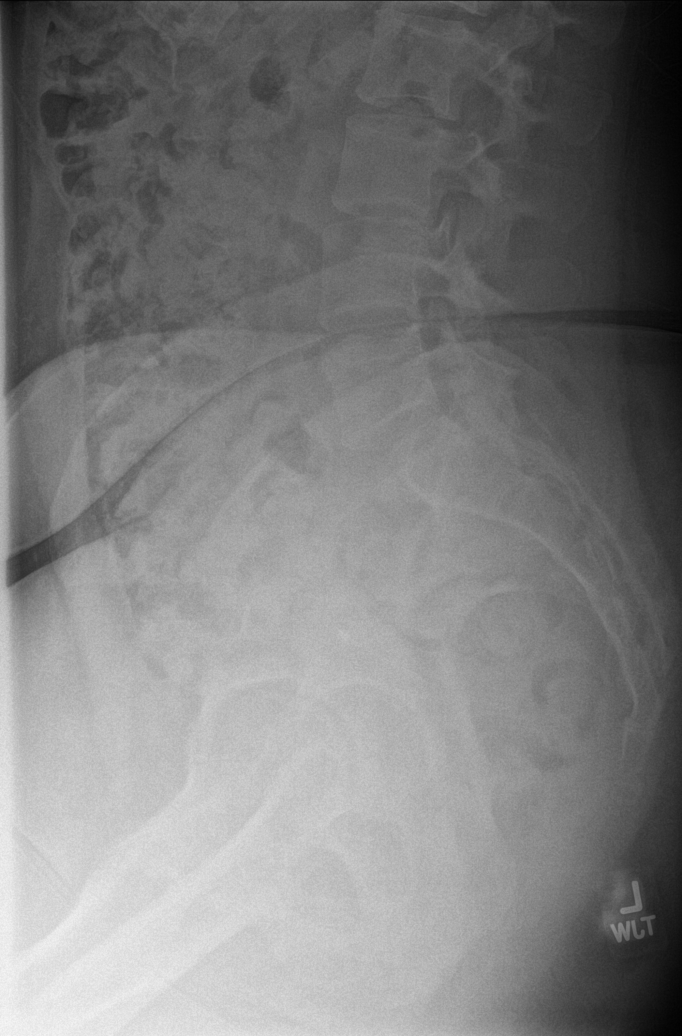

[5 of 5 positions shown; findings below may reference images not displayed]

FINDINGS: There are 5 non rib-bearing lumbar type vertebral bodies with
diminutive ribs seen bilaterally at T12.

There is a mild scoliotic curvature of the thoracolumbar spine with
dominant caudal component convex to the left measuring approximately
8 degrees (as measured from the superior endplate of L1 to the
inferior endplate of L4). No anterolisthesis or retrolisthesis. No
definite pars defects.

Lumbar vertebral body heights appear preserved. Lumbar
intervertebral disc space heights appear preserved given obliquity.

Post cholecystectomy. Uterine device overlies the midline of lower
pelvis. A surgical clip overlies the left hemipelvis, potentially a
displaced cholecystectomy clip.

Nonobstructive bowel gas pattern.
IMPRESSION: Mild scoliotic curvature of the thoracolumbar spine, potentially
positional. Otherwise, normal lumbar spine radiographs.

## 2019-11-29 HISTORY — PX: BICEPS TENDON REPAIR: SHX566

## 2020-02-29 ENCOUNTER — Ambulatory Visit: Payer: 59 | Attending: Internal Medicine

## 2020-02-29 DIAGNOSIS — Z23 Encounter for immunization: Secondary | ICD-10-CM

## 2020-02-29 NOTE — Progress Notes (Signed)
   Covid-19 Vaccination Clinic  Name:  [name] [name]    MRN: [id number] DOB: 08-25-1981  02/29/2020  [name] [name] was observed post Covid-19 immunization for 15 minutes without incident. She was provided with Vaccine Information Sheet and instruction to access the V-Safe system.   [name] [name] was instructed to call 911 with any severe reactions post vaccine: Marland Kitchen Difficulty breathing  . Swelling of face and throat  . A fast heartbeat  . A bad rash all over body  . Dizziness and weakness   Immunizations Administered    Name Date Dose VIS Date Route   Pfizer COVID-19 Vaccine 02/29/2020 11:53 AM 0.3 mL 11/08/2019 Intramuscular   Manufacturer: [employer], [employer]   Lot: [id number]   NDC: [address]-7

## 2020-03-24 ENCOUNTER — Ambulatory Visit: Payer: 59 | Attending: Internal Medicine

## 2020-03-24 DIAGNOSIS — Z23 Encounter for immunization: Secondary | ICD-10-CM

## 2020-03-24 NOTE — Progress Notes (Signed)
   Covid-19 Vaccination Clinic  Name:  [name] [name]    MRN: [id number] DOB: 1980/12/22  03/24/2020  Ms. Dutson was observed post Covid-19 immunization for 15 minutes without incident. She was provided with Vaccine Information Sheet and instruction to access the V-Safe system.   Ms. Gardenhire was instructed to call 911 with any severe reactions post vaccine: Marland Kitchen Difficulty breathing  . Swelling of face and throat  . A fast heartbeat  . A bad rash all over body  . Dizziness and weakness   Immunizations Administered    Name Date Dose VIS Date Route   Pfizer COVID-19 Vaccine 03/24/2020 10:33 AM 0.3 mL 01/22/2019 Intramuscular   Manufacturer: [employer], [employer]   Lot: [id number]   NDC: [address]-6

## 2020-09-07 ENCOUNTER — Other Ambulatory Visit: Payer: Self-pay

## 2020-09-07 ENCOUNTER — Ambulatory Visit (INDEPENDENT_AMBULATORY_CARE_PROVIDER_SITE_OTHER): Payer: 59 | Admitting: Family Medicine

## 2020-09-07 ENCOUNTER — Encounter: Payer: Self-pay | Admitting: Family Medicine

## 2020-09-07 VITALS — BP 130/78 | HR 72 | Temp 97.2°F | Ht 68.0 in | Wt 204.3 lb

## 2020-09-07 DIAGNOSIS — Z23 Encounter for immunization: Secondary | ICD-10-CM

## 2020-09-07 DIAGNOSIS — E669 Obesity, unspecified: Secondary | ICD-10-CM | POA: Insufficient documentation

## 2020-09-07 DIAGNOSIS — Z1159 Encounter for screening for other viral diseases: Secondary | ICD-10-CM | POA: Diagnosis not present

## 2020-09-07 DIAGNOSIS — Z0000 Encounter for general adult medical examination without abnormal findings: Secondary | ICD-10-CM

## 2020-09-07 DIAGNOSIS — E559 Vitamin D deficiency, unspecified: Secondary | ICD-10-CM

## 2020-09-07 LAB — CBC WITH DIFFERENTIAL/PLATELET
Basophils Absolute: 0.1 10*3/uL (ref 0.0–0.1)
Basophils Relative: 0.7 % (ref 0.0–3.0)
Eosinophils Absolute: 0.2 10*3/uL (ref 0.0–0.7)
Eosinophils Relative: 3.1 % (ref 0.0–5.0)
HCT: 40.4 % (ref 36.0–46.0)
Hemoglobin: 13.4 g/dL (ref 12.0–15.0)
Lymphocytes Relative: 26.6 % (ref 12.0–46.0)
Lymphs Abs: 1.8 10*3/uL (ref 0.7–4.0)
MCHC: 33.2 g/dL (ref 30.0–36.0)
MCV: 91.5 fl (ref 78.0–100.0)
Monocytes Absolute: 0.5 10*3/uL (ref 0.1–1.0)
Monocytes Relative: 7.8 % (ref 3.0–12.0)
Neutro Abs: 4.2 10*3/uL (ref 1.4–7.7)
Neutrophils Relative %: 61.8 % (ref 43.0–77.0)
Platelets: 207 10*3/uL (ref 150.0–400.0)
RBC: 4.41 Mil/uL (ref 3.87–5.11)
RDW: 13.2 % (ref 11.5–15.5)
WBC: 6.8 10*3/uL (ref 4.0–10.5)

## 2020-09-07 LAB — LIPID PANEL
Cholesterol: 151 mg/dL (ref 0–200)
HDL: 44.8 mg/dL (ref >39.00)
LDL Cholesterol: 82 mg/dL (ref 0–99)
NonHDL: 106.43
Total CHOL/HDL Ratio: 3
Triglycerides: 120 mg/dL (ref 0.0–149.0)
VLDL: 24 mg/dL (ref 0.0–40.0)

## 2020-09-07 LAB — COMPREHENSIVE METABOLIC PANEL
ALT: 40 U/L — ABNORMAL HIGH (ref 0–35)
AST: 27 U/L (ref 0–37)
Albumin: 4.4 g/dL (ref 3.5–5.2)
Alkaline Phosphatase: 76 U/L (ref 39–117)
BUN: 18 mg/dL (ref 6–23)
CO2: 26 mEq/L (ref 19–32)
Calcium: 9.5 mg/dL (ref 8.4–10.5)
Chloride: 105 mEq/L (ref 96–112)
Creatinine, Ser: 1.1 mg/dL (ref 0.40–1.20)
GFR: 63.05 mL/min (ref >60.00)
Glucose, Bld: 92 mg/dL (ref 70–99)
Potassium: 4.2 mEq/L (ref 3.5–5.1)
Sodium: 138 mEq/L (ref 135–145)
Total Bilirubin: 0.4 mg/dL (ref 0.2–1.2)
Total Protein: 7.3 g/dL (ref 6.0–8.3)

## 2020-09-07 LAB — VITAMIN D 25 HYDROXY (VIT D DEFICIENCY, FRACTURES): VITD: 41.33 ng/mL (ref 30.00–100.00)

## 2020-09-07 LAB — TSH: TSH: 3.37 u[IU]/mL (ref 0.35–4.50)

## 2020-09-07 MED ORDER — TRIAMCINOLONE ACETONIDE 0.1 % EX CREA: TOPICAL_CREAM | CUTANEOUS | 3 refills | Status: DC

## 2020-09-07 NOTE — Progress Notes (Signed)
Subjective:    Patient ID: [name] [name], female    DOB: [birth year], 39 y.o.   MRN: [id number]  This visit occurred during the SARS-CoV-2 public health emergency.  Safety protocols were in place, including screening questions prior to the visit, additional usage of staff PPE, and extensive cleaning of exam room while observing appropriate contact time as indicated for disinfecting solutions.    HPI Here for health maintenance exam and to review chronic medical problems    Wt Readings from Last 3 Encounters:  09/07/20 204 lb 5 oz (92.7 kg)  09/06/19 194 lb (88 kg)  09/03/18 200 lb (90.7 kg)   31.07 kg/m   Had bicep tendon tear and shoulder injury   (unsure how she did it)  Recent surgery  2 weeks to return to surgery   (in PT)  [facility]   Had gone back to the gym before that /boot camp  That is when she had problems  Temp has 5 lb lifting restriction   Hep C status   Flu shot - will have today  covid immunized pfizer  Tdap 8/17   Pap - done by gyn 6/19 neg with neg HPV screen  (had exam in 6/21)  Has IUD for contraception  occ spotting/no periods  Will discuss mammogram first Dr Marcelle Overlie - plans to get in June when 40    BP Readings from Last 3 Encounters:  09/07/20 130/78  09/06/19 108/72  09/03/18 124/80   Pulse Readings from Last 3 Encounters:  09/07/20 72  09/06/19 (!) 57  09/03/18 99   Diet has been about the same Tries not to eat junk food   Migraines- fairly well controlled  topiramate works well for her (was on ext release now taking short acting)   Her vit D was low at the ortho office On high dose weekly since early in the year   Patient Active Problem List   Diagnosis Date Noted  . Vitamin D deficiency 09/07/2020  . Encounter for hepatitis C screening test for low risk patient 09/07/2020  . Coccyx pain 05/01/2018  . Rosacea 01/06/2015  . Routine general medical examination at a health care facility 01/06/2015  . Migraine  01/06/2015   Past Medical History:  Diagnosis Date  . Migraine   . Rosacea    Past Surgical History:  Procedure Laterality Date  . CESAREAN SECTION  2007  . GALLBLADDER SURGERY  2007   Social History   Tobacco Use  . Smoking status: Never Smoker  . Smokeless tobacco: Never Used  Substance Use Topics  . Alcohol use: No    Alcohol/week: 0.0 standard drinks  . Drug use: No   Family History  Problem Relation Age of Onset  . Cancer Maternal Grandmother    Allergies  Allergen Reactions  . Sulfa Antibiotics   . Penicillins Rash   Current Outpatient Medications on File Prior to Visit  Medication Sig Dispense Refill  . topiramate (TOPAMAX) 100 MG tablet Take 1 tablet by mouth at bedtime.    . TOSYMRA 10 MG/ACT SOLN Place 1 spray into both nostrils daily as needed for migraine.    . Vitamin D, Ergocalciferol, (DRISDOL) 1.25 MG (50000 UNIT) CAPS capsule Take 50,000 Units by mouth once a week.     No current facility-administered medications on file prior to visit.    Review of Systems  Constitutional: Negative for activity change, appetite change, fatigue, fever and unexpected weight change.  HENT: Negative for congestion, ear pain,  rhinorrhea, sinus pressure and sore throat.   Eyes: Negative for pain, redness and visual disturbance.  Respiratory: Negative for cough, shortness of breath and wheezing.   Cardiovascular: Negative for chest pain and palpitations.  Gastrointestinal: Negative for abdominal pain, blood in stool, constipation and diarrhea.  Endocrine: Negative for polydipsia and polyuria.  Genitourinary: Negative for dysuria, frequency and urgency.  Musculoskeletal: Negative for arthralgias, back pain and myalgias.       Arm pain - recovering from bicep tendon surgery  Skin: Negative for pallor and rash.  Allergic/Immunologic: Negative for environmental allergies.  Neurological: Positive for headaches. Negative for dizziness and syncope.  Hematological: Negative for  adenopathy. Does not bruise/bleed easily.  Psychiatric/Behavioral: Negative for decreased concentration and dysphoric mood. The patient is not nervous/anxious.        Objective:   Physical Exam Constitutional:      General: She is not in acute distress.    Appearance: Normal appearance. She is well-developed. She is obese. She is not ill-appearing or diaphoretic.  HENT:     Head: Normocephalic and atraumatic.     Right Ear: Tympanic membrane, ear canal and external ear normal.     Left Ear: Tympanic membrane, ear canal and external ear normal.     Nose: Nose normal. No congestion.     Mouth/Throat:     Mouth: Mucous membranes are moist.     Pharynx: Oropharynx is clear. No posterior oropharyngeal erythema.  Eyes:     General: No scleral icterus.    Extraocular Movements: Extraocular movements intact.     Conjunctiva/sclera: Conjunctivae normal.     Pupils: Pupils are equal, round, and reactive to light.  Neck:     Thyroid: No thyromegaly.     Vascular: No carotid bruit or JVD.  Cardiovascular:     Rate and Rhythm: Normal rate and regular rhythm.     Pulses: Normal pulses.     Heart sounds: Normal heart sounds. No gallop.   Pulmonary:     Effort: Pulmonary effort is normal. No respiratory distress.     Breath sounds: Normal breath sounds. No wheezing.     Comments: Good air exch Chest:     Chest wall: No tenderness.  Abdominal:     General: Bowel sounds are normal. There is no distension or abdominal bruit.     Palpations: Abdomen is soft. There is no mass.     Tenderness: There is no abdominal tenderness.     Hernia: No hernia is present.  Genitourinary:    Comments: Breast and gyn exams from gyn provider     Musculoskeletal:        General: No tenderness. Normal range of motion.     Cervical back: Normal range of motion and neck supple. No rigidity. No muscular tenderness.     Right lower leg: No edema.     Left lower leg: No edema.  Lymphadenopathy:     Cervical: No  cervical adenopathy.  Skin:    General: Skin is warm and dry.     Coloration: Skin is not pale.     Findings: No erythema or rash.  Neurological:     Mental Status: She is alert. Mental status is at baseline.     Cranial Nerves: No cranial nerve deficit.     Motor: No abnormal muscle tone.     Coordination: Coordination normal.     Gait: Gait normal.     Deep Tendon Reflexes: Reflexes are normal and symmetric. Reflexes normal.  Psychiatric:        Mood and Affect: Mood normal.        Cognition and Memory: Cognition and memory normal.           Assessment & Plan:   Problem List Items Addressed This Visit      Other   Routine general medical examination at a health care facility - Primary    Reviewed health habits including diet and exercise and skin cancer prevention Reviewed appropriate screening tests for age  Also reviewed health mt list, fam hx and immunization status , as well as social and family history   See HPI  Labs ordered  Flu shot given Is covid immunized  Hep C screen done utd gyn care with gyn provider/ IUD  Will get back to exercise after recovery from ortho surgery        Relevant Orders   CBC with Differential/Platelet   Comprehensive metabolic panel   Lipid panel   TSH   Vitamin D deficiency    Low at ortho office Now on high dose weekly ergocalciferol for ? 6 mo  Level today  May be able to change to otc daily tx  Disc imp to bone and overall health      Relevant Orders   VITAMIN D 25 Hydroxy (Vit-D Deficiency, Fractures)   Encounter for hepatitis C screening test for low risk patient    Screening today for low risk pt       Relevant Orders   Hepatitis C antibody    Other Visit Diagnoses    Need for influenza vaccination       Relevant Orders   Flu Vaccine QUAD 6+ mos PF IM (Fluarix Quad PF) (Completed)

## 2020-09-07 NOTE — Assessment & Plan Note (Signed)
Low at ortho office Now on high dose weekly ergocalciferol for ? 6 mo  Level today  May be able to change to otc daily tx  Disc imp to bone and overall health

## 2020-09-07 NOTE — Patient Instructions (Signed)
Take care of yourself   Get back to exercise when you are recovered  Eat a balanced diet   Labs today   Flu shot today    We will check vitamin D level and make a plan re: D supplementation

## 2020-09-07 NOTE — Assessment & Plan Note (Signed)
Reviewed health habits including diet and exercise and skin cancer prevention Reviewed appropriate screening tests for age  Also reviewed health mt list, fam hx and immunization status , as well as social and family history   See HPI  Labs ordered  Flu shot given Is covid immunized  Hep C screen done utd gyn care with gyn provider/ IUD  Will get back to exercise after recovery from ortho surgery

## 2020-09-07 NOTE — Assessment & Plan Note (Signed)
Screening today for low risk pt

## 2020-09-07 NOTE — Assessment & Plan Note (Signed)
Discussed how this problem influences overall health and the risks it imposes  Reviewed plan for weight loss with lower calorie diet (via better food choices and also portion control or program like weight watchers) and exercise building up to or more than 30 minutes 5 days per week including some aerobic activity   Will return to exercise once healed from arm surgery

## 2020-09-08 LAB — HEPATITIS C ANTIBODY
Hepatitis C Ab: NONREACTIVE
SIGNAL TO CUT-OFF: 0.02 (ref <1.00)

## 2020-09-21 ENCOUNTER — Telehealth: Payer: Self-pay | Admitting: Family Medicine

## 2020-09-21 DIAGNOSIS — R7401 Elevation of levels of liver transaminase levels: Secondary | ICD-10-CM | POA: Insufficient documentation

## 2020-09-21 NOTE — Telephone Encounter (Signed)
-----   Message from Alvina Chou sent at 09/08/2020  2:52 PM EDT ----- Regarding: Lab orders for Tuesday, 10.26.21 Lab orders for 2 week labs

## 2020-09-22 ENCOUNTER — Other Ambulatory Visit (INDEPENDENT_AMBULATORY_CARE_PROVIDER_SITE_OTHER): Payer: 59

## 2020-09-22 ENCOUNTER — Other Ambulatory Visit: Payer: Self-pay

## 2020-09-22 DIAGNOSIS — R7401 Elevation of levels of liver transaminase levels: Secondary | ICD-10-CM | POA: Diagnosis not present

## 2020-09-22 LAB — HEPATIC FUNCTION PANEL
ALT: 16 U/L (ref 0–35)
AST: 15 U/L (ref 0–37)
Albumin: 4.1 g/dL (ref 3.5–5.2)
Alkaline Phosphatase: 70 U/L (ref 39–117)
Bilirubin, Direct: 0.1 mg/dL (ref 0.0–0.3)
Total Bilirubin: 0.4 mg/dL (ref 0.2–1.2)
Total Protein: 6.8 g/dL (ref 6.0–8.3)

## 2020-09-24 ENCOUNTER — Encounter: Payer: Self-pay | Admitting: *Deleted

## 2021-09-23 ENCOUNTER — Ambulatory Visit (INDEPENDENT_AMBULATORY_CARE_PROVIDER_SITE_OTHER): Payer: 59

## 2021-09-23 ENCOUNTER — Encounter: Payer: Self-pay | Admitting: Podiatry

## 2021-09-23 ENCOUNTER — Other Ambulatory Visit: Payer: Self-pay

## 2021-09-23 ENCOUNTER — Ambulatory Visit: Payer: 59 | Admitting: Podiatry

## 2021-09-23 DIAGNOSIS — M722 Plantar fascial fibromatosis: Secondary | ICD-10-CM

## 2021-09-23 MED ORDER — TRIAMCINOLONE ACETONIDE 10 MG/ML IJ SUSP
10.0000 mg | Freq: Once | INTRAMUSCULAR | Status: AC
  Administered 2021-09-23: 10 mg

## 2021-09-23 MED ORDER — DICLOFENAC SODIUM 75 MG PO TBEC: 75.0000 mg | DELAYED_RELEASE_TABLET | Freq: Two times a day (BID) | ORAL | 2 refills | Status: DC

## 2021-09-23 NOTE — Patient Instructions (Signed)

## 2021-09-24 NOTE — Progress Notes (Signed)
Subjective:   Patient ID: [name] [name], female   DOB: 40 y.o.   MRN: [id number]   HPI Patient has developed a lot of discomfort in the plantar aspect of the right arch x2 months.  States that its been getting gradually worse and making it hard for her to bear weight on her foot and it is hard for her to be active.  Patient has not had this in the past   Review of Systems  All other systems reviewed and are negative.      Objective:  Physical Exam Vitals and nursing note reviewed.  Constitutional:      Appearance: She is well-developed.  Pulmonary:     Effort: Pulmonary effort is normal.  Musculoskeletal:        General: Normal range of motion.  Skin:    General: Skin is warm.  Neurological:     Mental Status: She is alert.    Neurovascular status intact muscle strength found to be adequate range of motion adequate.  Patient is found to have exquisite discomfort in the medial band of the right plantar fascia at its insertion into the calcaneus with inflammation and fluid buildup.  Patient is noted to have good digital perfusion is well oriented x3     Assessment:  Acute plantar fasciitis right with inflammation fluid buildup around the medial band     Plan:  H&P reviewed condition and at this point recommended conservative treatment.  Sterile prep injected the fascia 3 mg Kenalog 5 mg Xylocaine applied fascial brace gave instructions for physical therapy support therapy and reappoint to reevaluate  X-rays indicate small spur no indication stress fracture arthritis

## 2021-10-07 ENCOUNTER — Encounter: Payer: Self-pay | Admitting: Podiatry

## 2021-10-07 ENCOUNTER — Other Ambulatory Visit: Payer: Self-pay

## 2021-10-07 ENCOUNTER — Ambulatory Visit: Payer: 59 | Admitting: Podiatry

## 2021-10-07 DIAGNOSIS — M722 Plantar fascial fibromatosis: Secondary | ICD-10-CM

## 2021-10-07 NOTE — Progress Notes (Signed)
Subjective:   Patient ID: [name] [name], female   DOB: 40 y.o.   MRN: [id number]   HPI Patient states doing much better with her foot with diminished discomfort and inflammation   ROS      Objective:  Physical Exam  Neurovascular status intact inflammation of the right plantar fascia that improved quite a bit with pain only upon deep palpation     Assessment:  Planter fasciitis right improved     Plan:  Reviewed exercises anti-inflammatories shoe gear modification patient will be seen back may require other treatments depending on her response over the next several months

## 2021-10-08 ENCOUNTER — Ambulatory Visit: Payer: 59 | Admitting: Podiatry

## 2022-03-14 ENCOUNTER — Ambulatory Visit: Payer: 59

## 2022-03-14 ENCOUNTER — Ambulatory Visit: Payer: 59 | Admitting: Podiatry

## 2022-03-14 DIAGNOSIS — M722 Plantar fascial fibromatosis: Secondary | ICD-10-CM

## 2022-03-14 MED ORDER — TRIAMCINOLONE ACETONIDE 10 MG/ML IJ SUSP: 10.0000 mg | Freq: Once | INTRAMUSCULAR | Status: AC

## 2022-03-14 NOTE — Progress Notes (Signed)
Subjective:  ? ?Patient ID: [name] [name], female   DOB: 42 y.o.   MRN: [id number]  ? ?HPI ?Patient states her right heel has been very sore and it got much worse over the last month.  Does not remember specific injury ? ? ?ROS ? ? ?   ?Objective:  ?Physical Exam  ?Neurovascular status intact exquisite discomfort with narrow right heel slight diminishment of the fat pad with inflammation of the medial and central band ? ?   ?Assessment:  ?Chronic fasciitis right with small heel moderate flatfoot narrow foot which is probably contributing to the pathology ? ?   ?Plan:  ?H&P reviewed reviewed condition recommended long-term orthotics to try to reduce the stress on the heel and arch and patient is casted for functional orthotic devices.  I went ahead today and I did do a steroid injection plantar fascia this will probably be the last one 3 mg dexamethasone Kenalog 5 mg Xylocaine and continue orthotics currently when returned ?   ? ? ?

## 2022-03-14 NOTE — Progress Notes (Signed)
SITUATION ?Reason for Consult: Evaluation for Bilateral Custom Foot Orthoses ?Patient / Caregiver Report: Patient is ready for foot orthotics ? ?OBJECTIVE DATA: ?Patient History / Diagnosis:  ?  ICD-10-CM   ?1. Plantar fasciitis of right foot  M72.2   ?  ? ? ?Current or Previous Devices:   None and no history ? ?Foot Examination: ?Skin presentation:   Intact ?Ulcers & Callousing:   None ?Toe / Foot Deformities:  None ?Weight Bearing Presentation:  Rectus ?Sensation:    Intact ? ?Shoe Size:    9M ? ?ORTHOTIC RECOMMENDATION ?Recommended Device: 1x pair of custom functional foot orthotics ? ?GOALS OF ORTHOSES ?- Reduce Pain ?- Prevent Foot Deformity ?- Prevent Progression of Further Foot Deformity ?- Relieve Pressure ?- Improve the Overall Biomechanical Function of the Foot and Lower Extremity. ? ?ACTIONS PERFORMED ?Potential out of pocket cost was communicated to patient. Patient understood and consent to casting. Patient was casted for Foot Orthoses via crush box. Procedure was explained and patient tolerated procedure well. Casts were shipped to central fabrication. All questions were answered and concerns addressed. ? ?PLAN ?Patient is to be called for fitting when devices are ready.  ? ? ?

## 2022-04-05 ENCOUNTER — Telehealth: Payer: Self-pay

## 2022-04-05 NOTE — Telephone Encounter (Signed)
Called patient to schedule but patient was unable to access calendar at this time. Patient will reach out tomorrow to schedule pickup ?

## 2022-04-06 ENCOUNTER — Telehealth: Payer: Self-pay

## 2022-04-06 NOTE — Telephone Encounter (Signed)
Left message for patient to call back to schedule appt to pick up orthotic

## 2022-04-08 ENCOUNTER — Ambulatory Visit: Payer: 59

## 2022-04-08 DIAGNOSIS — M722 Plantar fascial fibromatosis: Secondary | ICD-10-CM | POA: Diagnosis not present

## 2022-04-08 NOTE — Progress Notes (Signed)

## 2022-06-08 LAB — HM MAMMOGRAPHY: HM Mammogram: NORMAL (ref 0–4)

## 2022-08-02 ENCOUNTER — Telehealth: Payer: Self-pay | Admitting: Family Medicine

## 2022-08-02 DIAGNOSIS — Z0000 Encounter for general adult medical examination without abnormal findings: Secondary | ICD-10-CM

## 2022-08-02 DIAGNOSIS — E559 Vitamin D deficiency, unspecified: Secondary | ICD-10-CM

## 2022-08-02 NOTE — Telephone Encounter (Signed)
-----   Message from Alvina Chou sent at 07/26/2022 11:47 AM EDT ----- Regarding: Lab orders for Wednesday, 9.6.23 Patient is scheduled for CPX labs, please order future labs, Thanks , Camelia Eng

## 2022-08-03 ENCOUNTER — Other Ambulatory Visit (INDEPENDENT_AMBULATORY_CARE_PROVIDER_SITE_OTHER): Payer: 59

## 2022-08-03 DIAGNOSIS — E559 Vitamin D deficiency, unspecified: Secondary | ICD-10-CM | POA: Diagnosis not present

## 2022-08-03 DIAGNOSIS — Z0000 Encounter for general adult medical examination without abnormal findings: Secondary | ICD-10-CM

## 2022-08-03 LAB — CBC WITH DIFFERENTIAL/PLATELET
Basophils Absolute: 0.1 10*3/uL (ref 0.0–0.1)
Basophils Relative: 0.7 % (ref 0.0–3.0)
Eosinophils Absolute: 0.2 10*3/uL (ref 0.0–0.7)
Eosinophils Relative: 1.3 % (ref 0.0–5.0)
HCT: 40.3 % (ref 36.0–46.0)
Hemoglobin: 13.2 g/dL (ref 12.0–15.0)
Lymphocytes Relative: 23.5 % (ref 12.0–46.0)
Lymphs Abs: 2.8 10*3/uL (ref 0.7–4.0)
MCHC: 32.8 g/dL (ref 30.0–36.0)
MCV: 90.1 fl (ref 78.0–100.0)
Monocytes Absolute: 0.8 10*3/uL (ref 0.1–1.0)
Monocytes Relative: 6.9 % (ref 3.0–12.0)
Neutro Abs: 8.1 10*3/uL — ABNORMAL HIGH (ref 1.4–7.7)
Neutrophils Relative %: 67.6 % (ref 43.0–77.0)
Platelets: 216 10*3/uL (ref 150.0–400.0)
RBC: 4.48 Mil/uL (ref 3.87–5.11)
RDW: 13.2 % (ref 11.5–15.5)
WBC: 12 10*3/uL — ABNORMAL HIGH (ref 4.0–10.5)

## 2022-08-03 LAB — COMPREHENSIVE METABOLIC PANEL
ALT: 14 U/L (ref 0–35)
AST: 15 U/L (ref 0–37)
Albumin: 4.1 g/dL (ref 3.5–5.2)
Alkaline Phosphatase: 77 U/L (ref 39–117)
BUN: 14 mg/dL (ref 6–23)
CO2: 24 mEq/L (ref 19–32)
Calcium: 9.6 mg/dL (ref 8.4–10.5)
Chloride: 105 mEq/L (ref 96–112)
Creatinine, Ser: 0.98 mg/dL (ref 0.40–1.20)
GFR: 71.83 mL/min (ref >60.00)
Glucose, Bld: 92 mg/dL (ref 70–99)
Potassium: 4 mEq/L (ref 3.5–5.1)
Sodium: 138 mEq/L (ref 135–145)
Total Bilirubin: 0.6 mg/dL (ref 0.2–1.2)
Total Protein: 7.4 g/dL (ref 6.0–8.3)

## 2022-08-03 LAB — VITAMIN D 25 HYDROXY (VIT D DEFICIENCY, FRACTURES): VITD: 25.3 ng/mL — ABNORMAL LOW (ref 30.00–100.00)

## 2022-08-03 LAB — LIPID PANEL
Cholesterol: 154 mg/dL (ref 0–200)
HDL: 49.6 mg/dL (ref >39.00)
LDL Cholesterol: 83 mg/dL (ref 0–99)
NonHDL: 104.8
Total CHOL/HDL Ratio: 3
Triglycerides: 110 mg/dL (ref 0.0–149.0)
VLDL: 22 mg/dL (ref 0.0–40.0)

## 2022-08-03 LAB — TSH: TSH: 4.58 u[IU]/mL (ref 0.35–5.50)

## 2022-08-09 ENCOUNTER — Ambulatory Visit (INDEPENDENT_AMBULATORY_CARE_PROVIDER_SITE_OTHER): Payer: 59 | Admitting: Family Medicine

## 2022-08-09 ENCOUNTER — Encounter: Payer: Self-pay | Admitting: Family Medicine

## 2022-08-09 VITALS — BP 112/68 | HR 81 | Temp 98.0°F | Ht 68.0 in | Wt 216.1 lb

## 2022-08-09 DIAGNOSIS — Z23 Encounter for immunization: Secondary | ICD-10-CM | POA: Diagnosis not present

## 2022-08-09 DIAGNOSIS — E559 Vitamin D deficiency, unspecified: Secondary | ICD-10-CM | POA: Diagnosis not present

## 2022-08-09 DIAGNOSIS — G43709 Chronic migraine without aura, not intractable, without status migrainosus: Secondary | ICD-10-CM | POA: Diagnosis not present

## 2022-08-09 DIAGNOSIS — L719 Rosacea, unspecified: Secondary | ICD-10-CM

## 2022-08-09 DIAGNOSIS — Z0000 Encounter for general adult medical examination without abnormal findings: Secondary | ICD-10-CM

## 2022-08-09 DIAGNOSIS — M722 Plantar fascial fibromatosis: Secondary | ICD-10-CM | POA: Insufficient documentation

## 2022-08-09 DIAGNOSIS — E669 Obesity, unspecified: Secondary | ICD-10-CM

## 2022-08-09 NOTE — Assessment & Plan Note (Signed)
Discussed how this problem influences overall health and the risks it imposes  Reviewed plan for weight loss with lower calorie diet (via better food choices and also portion control or program like weight watchers) and exercise building up to or more than 30 minutes 5 days per week including some aerobic activity    

## 2022-08-09 NOTE — Progress Notes (Signed)
Subjective:    Patient ID: [name] [name], female    DOB: Jun 06, 1981, 41 y.o.   MRN: [id number]  HPI Here for health maintenance exam and to review chronic medical problems    Wt Readings from Last 3 Encounters:  08/09/22 216 lb 2 oz (98 kg)  09/07/20 204 lb 5 oz (92.7 kg)  09/06/19 194 lb (88 kg)   32.86 kg/m  Staying busy  Daughter in HS -softball/looking at colleges  Working   Feeling good  Hanging in there-emotionally fine   Immunization History  Administered Date(s) Administered   Influenza,inj,Quad PF,6+ Mos 01/06/2015, 09/16/2015, 07/27/2016, 08/29/2017, 09/03/2018, 09/06/2019, 09/07/2020   PFIZER(Purple Top)SARS-COV-2 Vaccination 02/29/2020, 03/24/2020   Tdap 07/27/2016    Health Maintenance Due  Topic Date Due   COVID-19 Vaccine (3 - Pfizer series) 05/19/2020   PAP SMEAR-Modifier  04/30/2021   INFLUENZA VACCINE  06/28/2022   Flu shot today   Gyn care  Dr Matthew Saras  Had visit [facility]08/[facility]  Has IUD  Pap 04/2018  nl with neg HPV   Mammogram: mammogram 06/08/22 at gyn office -normal  Self breast exam: no lumps   BP Readings from Last 3 Encounters:  08/09/22 112/68  09/07/20 130/78  09/06/19 108/72   Pulse Readings from Last 3 Encounters:  08/09/22 81  09/07/20 72  09/06/19 (!) 57   Exercise - not much  Dealing with plantar fasciitis -tx and injections from podiatry  Hurt a ligament and has to wear a boot also  May have to have a procedure  Shoulder surgery also in past 3 y   When she recovers would like to go to gym  (used to like boot camp)   H/o migraines  Under control  , doing well  Uses tosymra nasal spray   H/o low vit D level - not taking any  25.3 this draw   Labs Lab Results  Component Value Date   CREATININE 0.98 08/03/2022   BUN 14 08/03/2022   NA 138 08/03/2022   K 4.0 08/03/2022   CL 105 08/03/2022   CO2 24 08/03/2022   Lab Results  Component Value Date   ALT 14 08/03/2022   AST 15 08/03/2022   ALKPHOS 77  08/03/2022   BILITOT 0.6 08/03/2022   Glucose of 92 Lab Results  Component Value Date   WBC 12.0 (H) 08/03/2022   HGB 13.2 08/03/2022   HCT 40.3 08/03/2022   MCV 90.1 08/03/2022   PLT 216.0 08/03/2022   Lab Results  Component Value Date   TSH 4.58 08/03/2022    Cholesterol Lab Results  Component Value Date   CHOL 154 08/03/2022   CHOL 151 09/07/2020   CHOL 147 09/06/2019   Lab Results  Component Value Date   HDL 49.60 08/03/2022   HDL 44.80 09/07/2020   HDL 47.10 09/06/2019   Lab Results  Component Value Date   LDLCALC 83 08/03/2022   LDLCALC 82 09/07/2020   LDLCALC 84 09/06/2019   Lab Results  Component Value Date   TRIG 110.0 08/03/2022   TRIG 120.0 09/07/2020   TRIG 82.0 09/06/2019   Lab Results  Component Value Date   CHOLHDL 3 08/03/2022   CHOLHDL 3 09/07/2020   CHOLHDL 3 09/06/2019   No results found for: "LDLDIRECT"  Eats fairly healthy much of the time  Some snacking    Patient Active Problem List   Diagnosis Date Noted   Elevated transaminase level 09/21/2020   Vitamin D deficiency 09/07/2020  Encounter for hepatitis C screening test for low risk patient 09/07/2020   Obesity (BMI 30-39.9) 09/07/2020   Rosacea 01/06/2015   Routine general medical examination at a health care facility 01/06/2015   Migraine 01/06/2015   Past Medical History:  Diagnosis Date   Migraine    Rosacea    Past Surgical History:  Procedure Laterality Date   BICEPS TENDON REPAIR  2021   murphy wainer   CESAREAN SECTION  2007   GALLBLADDER SURGERY  2007   Social History   Tobacco Use   Smoking status: Never   Smokeless tobacco: Never  Substance Use Topics   Alcohol use: No    Alcohol/week: 0.0 standard drinks of alcohol   Drug use: No   Family History  Problem Relation Age of Onset   Cancer Maternal Grandmother    Allergies  Allergen Reactions   Sulfa Antibiotics    Penicillins Rash   Current Outpatient Medications on File Prior to Visit   Medication Sig Dispense Refill   cholecalciferol (VITAMIN D3) 25 MCG (1000 UNIT) tablet Take 2,000 Units by mouth daily.     topiramate (TOPAMAX) 100 MG tablet Take 1 tablet by mouth at bedtime.     TOSYMRA 10 MG/ACT SOLN Place 1 spray into both nostrils daily as needed for migraine.     triamcinolone cream (KENALOG) 0.1 % APPLY A TINY AMOUNT TO AFFECTED AREA TOPICALLY TWICE DAILY 30 g 3   Current Facility-Administered Medications on File Prior to Visit  Medication Dose Route Frequency Provider Last Rate Last Admin   triamcinolone acetonide (KENALOG) 10 MG/ML injection 10 mg  10 mg Other Once Lenn Sink, DPM         Review of Systems  Constitutional:  Negative for activity change, appetite change, fatigue, fever and unexpected weight change.  HENT:  Negative for congestion, ear pain, rhinorrhea, sinus pressure and sore throat.   Eyes:  Negative for pain, redness and visual disturbance.  Respiratory:  Negative for cough, shortness of breath and wheezing.   Cardiovascular:  Negative for chest pain and palpitations.  Gastrointestinal:  Negative for abdominal pain, blood in stool, constipation and diarrhea.  Endocrine: Negative for polydipsia and polyuria.  Genitourinary:  Negative for dysuria, frequency and urgency.  Musculoskeletal:  Positive for arthralgias. Negative for back pain and myalgias.       Plantar fasciitis pain   Skin:  Negative for pallor and rash.  Allergic/Immunologic: Negative for environmental allergies.  Neurological:  Negative for dizziness, syncope and headaches.  Hematological:  Negative for adenopathy. Does not bruise/bleed easily.  Psychiatric/Behavioral:  Negative for decreased concentration and dysphoric mood. The patient is not nervous/anxious.        Objective:   Physical Exam Constitutional:      General: She is not in acute distress.    Appearance: Normal appearance. She is well-developed. She is obese. She is not ill-appearing or diaphoretic.   HENT:     Head: Normocephalic and atraumatic.     Right Ear: Tympanic membrane, ear canal and external ear normal.     Left Ear: Tympanic membrane, ear canal and external ear normal.     Nose: Nose normal. No congestion.     Mouth/Throat:     Mouth: Mucous membranes are moist.     Pharynx: Oropharynx is clear. No posterior oropharyngeal erythema.  Eyes:     General: No scleral icterus.    Extraocular Movements: Extraocular movements intact.     Conjunctiva/sclera: Conjunctivae normal.  Pupils: Pupils are equal, round, and reactive to light.  Neck:     Thyroid: No thyromegaly.     Vascular: No carotid bruit or JVD.  Cardiovascular:     Rate and Rhythm: Normal rate and regular rhythm.     Pulses: Normal pulses.     Heart sounds: Normal heart sounds.     No gallop.  Pulmonary:     Effort: Pulmonary effort is normal. No respiratory distress.     Breath sounds: Normal breath sounds. No wheezing.     Comments: Good air exch Chest:     Chest wall: No tenderness.  Abdominal:     General: Bowel sounds are normal. There is no distension or abdominal bruit.     Palpations: Abdomen is soft. There is no mass.     Tenderness: There is no abdominal tenderness.     Hernia: No hernia is present.  Genitourinary:    Comments: Breast and pelvic exam done in 05/2022 by gyn provider  Musculoskeletal:        General: No tenderness. Normal range of motion.     Cervical back: Normal range of motion and neck supple. No rigidity. No muscular tenderness.     Right lower leg: No edema.     Left lower leg: No edema.     Comments: No kyphosis   Lymphadenopathy:     Cervical: No cervical adenopathy.  Skin:    General: Skin is warm and dry.     Coloration: Skin is not pale.     Findings: No erythema or rash.     Comments: Facial rosacea with flushed cheeks Solar lentigines diffusely   Neurological:     Mental Status: She is alert. Mental status is at baseline.     Cranial Nerves: No cranial  nerve deficit.     Motor: No abnormal muscle tone.     Coordination: Coordination normal.     Gait: Gait normal.     Deep Tendon Reflexes: Reflexes are normal and symmetric. Reflexes normal.  Psychiatric:        Mood and Affect: Mood normal.        Cognition and Memory: Cognition and memory normal.           Assessment & Plan:   Problem List Items Addressed This Visit       Cardiovascular and Mediastinum   Migraine    Doing well recently  tosymra prn         Musculoskeletal and Integument   Rosacea    Sees dermatology Wears sunscreen  Has not found a helpful tx yet        Other   Obesity (BMI 30-39.9)    Discussed how this problem influences overall health and the risks it imposes  Reviewed plan for weight loss with lower calorie diet (via better food choices and also portion control or program like weight watchers) and exercise building up to or more than 30 minutes 5 days per week including some aerobic activity         Routine general medical examination at a health care facility - Primary    Reviewed health habits including diet and exercise and skin cancer prevention Reviewed appropriate screening tests for age  Also reviewed health mt list, fam hx and immunization status , as well as social and family history   Flu shot given  Gyn care utd from July  Pap utd 04/2018 Mammogram 05/2022 and nl at gyn office  Enc her to  find exercise she tolerates with plantar fasciitis  Disc need for vit D3 2000 iu daily for bone and overall health       Vitamin D deficiency    Level of 25.3 Disc imp to bone and overall health  inst to start 2000 iu daily       Other Visit Diagnoses     Need for influenza vaccination       Relevant Orders   Flu Vaccine QUAD 6+ mos PF IM (Fluarix Quad PF) (Completed)

## 2022-08-09 NOTE — Assessment & Plan Note (Signed)
Doing well recently  tosymra prn

## 2022-08-09 NOTE — Assessment & Plan Note (Signed)
Sees dermatology Wears sunscreen  Has not found a helpful tx yet

## 2022-08-09 NOTE — Patient Instructions (Addendum)
Ask your podiatrist what they would recommend for exercise  Resistance training is important   Get vitamin D 3 - 2000 iu daily   Take care of yourself   Try to get most of your carbohydrates from produce (with the exception of white potatoes)  Eat less bread/pasta/rice/snack foods/cereals/sweets and other items from the middle of the grocery store (processed carbs)  Use sunscreen

## 2022-08-09 NOTE — Assessment & Plan Note (Signed)
Level of 25.3 Disc imp to bone and overall health  inst to start 2000 iu daily

## 2022-08-09 NOTE — Assessment & Plan Note (Signed)
Reviewed health habits including diet and exercise and skin cancer prevention Reviewed appropriate screening tests for age  Also reviewed health mt list, fam hx and immunization status , as well as social and family history   Flu shot given  Gyn care utd from July  Pap utd 04/2018 Mammogram 05/2022 and nl at gyn office  Enc her to find exercise she tolerates with plantar fasciitis  Disc need for vit D3 2000 iu daily for bone and overall health

## 2022-11-03 ENCOUNTER — Ambulatory Visit: Payer: 59

## 2022-11-03 ENCOUNTER — Ambulatory Visit (INDEPENDENT_AMBULATORY_CARE_PROVIDER_SITE_OTHER): Payer: 59

## 2022-11-03 ENCOUNTER — Ambulatory Visit: Payer: 59 | Admitting: Podiatry

## 2022-11-03 ENCOUNTER — Encounter: Payer: Self-pay | Admitting: Podiatry

## 2022-11-03 DIAGNOSIS — M722 Plantar fascial fibromatosis: Secondary | ICD-10-CM

## 2022-11-03 MED ORDER — TRIAMCINOLONE ACETONIDE 10 MG/ML IJ SUSP
10.0000 mg | Freq: Once | INTRAMUSCULAR | Status: AC
  Administered 2022-11-03: 10 mg

## 2022-11-03 NOTE — Progress Notes (Signed)
Subjective:   Patient ID: [name] [name], female   DOB: 41 y.o.   MRN: [id number]   HPI Patient states her right heel has been severely tender and making it hard for her to walk on it.  States that her orthotics have helped her a little bit but she feels like they may be too short and it is very tender and she has a very narrow heel and a slightly diminished fat pad   ROS      Objective:  Physical Exam  Neurovascular status intact with long-term history of inflammation plantar fascial fluid buildup around the medial band of the plantar fascia right with patient not having been treated for 8 months     Assessment:  Acute plantar fasciitis right with inflammation fluid with structural changes of the arch and heel     Plan:  H&P reviewed condition went ahead today did sterile prep injected the plantar fascia at insertion 3 mg Kenalog 5 mg Xylocaine we will get her orthotics redone and due to the intensity of discomfort I did apply air fracture walker with all instructions on usage.  Patient will be seen back in approximately 2 weeks may need or other treatments depending on the response and understands this is a very difficult condition  X-rays do indicate small spur no indication stress fracture arthritis

## 2022-11-16 ENCOUNTER — Ambulatory Visit: Payer: 59 | Admitting: Podiatry

## 2022-11-16 ENCOUNTER — Encounter: Payer: Self-pay | Admitting: Podiatry

## 2022-11-16 DIAGNOSIS — M722 Plantar fascial fibromatosis: Secondary | ICD-10-CM

## 2022-11-16 NOTE — Progress Notes (Signed)
Subjective:   Patient ID: [name] [name], female   DOB: 41 y.o.   MRN: [id number]   HPI Patient states she is improving with boot usage right states that still having some discomfort but nowhere near to the same degree and boot has been very helpful neurovascular   ROS      Objective:  Physical Exam  Status intact with patient's right heel that improved still painful when I do deep Pressure but overall better     Assessment:  Improvement fasciitis right heel     Plan:  Reviewed condition recommended the continuation of conservative care and discussed supportive therapy gradual reduction of the boot ice therapy reappoint if symptoms indicate

## 2022-11-17 ENCOUNTER — Telehealth: Payer: Self-pay | Admitting: Podiatry

## 2022-11-17 DIAGNOSIS — M722 Plantar fascial fibromatosis: Secondary | ICD-10-CM

## 2022-11-17 NOTE — Telephone Encounter (Signed)
Left message on vm that patients orthotics are back.   Notes sent to Christan to post charges

## 2022-12-01 NOTE — Telephone Encounter (Signed)
Left message on vm that patients orthotics are back.

## 2022-12-15 NOTE — Progress Notes (Signed)
Patient presents today to pick up custom molded foot orthotics recommended by Dr. REGAL.   Orthotics were dispensed and fit was satisfactory. Reviewed instructions for break-in and wear. Written instructions given to patient.  Patient will follow up as needed.   

## 2022-12-16 ENCOUNTER — Ambulatory Visit: Payer: 59 | Admitting: *Deleted

## 2022-12-16 DIAGNOSIS — M722 Plantar fascial fibromatosis: Secondary | ICD-10-CM

## 2023-01-30 ENCOUNTER — Ambulatory Visit: Payer: 59 | Admitting: Podiatry

## 2023-01-30 DIAGNOSIS — M722 Plantar fascial fibromatosis: Secondary | ICD-10-CM | POA: Diagnosis not present

## 2023-01-30 NOTE — Progress Notes (Signed)
Subjective:   Patient ID: [name] [name], female   DOB: 42 y.o.   MRN: [id number]   HPI Patient states her heel is killing her and it has not responded to conservative and has been going on for a long time   ROS      Objective:  Physical Exam  Neurovascular status intact exquisite discomfort medial fascial band right at the insertional point of the tendon into the calcaneus with inflammation fluid around the medial band negative equinus condition noted     Assessment:  Acute Planter fasciitis right with inflammation fluid of the medial band     Plan:  H&P reviewed and due to the intense discomfort longstanding nature of this I have recommended endoscopic release of the medial band.  I did explain procedure risk allowed her to read consent form going over alternative treatments complications and patient signed consent form.  Patient understands recovery can take upwards of 6 months and all questions answered scheduled for outpatient surgery

## 2023-01-31 ENCOUNTER — Telehealth: Payer: Self-pay | Admitting: Urology

## 2023-01-31 NOTE — Telephone Encounter (Signed)
DOS - 02/14/23  EPF RIGHT --- [place] [id number] HAS BEEN APPROVED, AUTH # [id number], GOOD FROM 02/14/23 - 02/12/23.

## 2023-02-13 MED ORDER — HYDROCODONE-ACETAMINOPHEN 10-325 MG PO TABS: 1.0000 | ORAL_TABLET | Freq: Three times a day (TID) | ORAL | 0 refills | Status: AC | PRN

## 2023-02-13 NOTE — Addendum Note (Signed)
Addended by: Wallene Huh on: 02/13/2023 04:51 PM   Modules accepted: Orders

## 2023-02-14 DIAGNOSIS — M722 Plantar fascial fibromatosis: Secondary | ICD-10-CM

## 2023-02-20 ENCOUNTER — Ambulatory Visit (INDEPENDENT_AMBULATORY_CARE_PROVIDER_SITE_OTHER): Payer: 59 | Admitting: Podiatry

## 2023-02-20 ENCOUNTER — Encounter: Payer: Self-pay | Admitting: Podiatry

## 2023-02-20 DIAGNOSIS — M722 Plantar fascial fibromatosis: Secondary | ICD-10-CM | POA: Diagnosis not present

## 2023-02-20 NOTE — Progress Notes (Signed)
Subjective:   Patient ID: [name] [name], female   DOB: 42 y.o.   MRN: [id number]   HPI Patient states doing very well with surgery having pain on the outside of the foot but overall feels good   ROS      Objective:  Physical Exam  Neurovascular status intact negative Bevelyn Buckles' sign noted wound edges well coapted stitches intact no drainage noted minimal plantar pain     Assessment:  Doing well but having trouble with the boot and being able to wear it at home due to the bulk in the pressure it is putting on the lateral side     Plan:  H&P dispense surgical shoe for driving carefully and did dispense a night splint that I want her to use when she is sitting laying down in order to stretch it as the boot is hard for her to tolerate.  Reappoint 2 weeks suture removal earlier if needed

## 2023-03-06 ENCOUNTER — Encounter: Payer: Self-pay | Admitting: Podiatry

## 2023-03-06 ENCOUNTER — Ambulatory Visit (INDEPENDENT_AMBULATORY_CARE_PROVIDER_SITE_OTHER): Payer: 59 | Admitting: Podiatry

## 2023-03-06 ENCOUNTER — Ambulatory Visit: Payer: 59

## 2023-03-06 DIAGNOSIS — Z9889 Other specified postprocedural states: Secondary | ICD-10-CM

## 2023-03-06 DIAGNOSIS — M722 Plantar fascial fibromatosis: Secondary | ICD-10-CM

## 2023-03-06 NOTE — Progress Notes (Signed)
  Subjective:  Patient ID: [name] [name], female    DOB: 04-13-1981,  MRN: [id number]  Chief Complaint  Patient presents with   Routine Post Op    POV2    DOS: 02/14/23  Procedure: Right EPF  42 y.o. female returns for POV#2. Relates doing well with minimal pain  Review of Systems: Negative except as noted in the HPI. Denies N/V/F/Ch.  Past Medical History:  Diagnosis Date   Migraine    Rosacea     Current Outpatient Medications:    cholecalciferol (VITAMIN D3) 25 MCG (1000 UNIT) tablet, Take 2,000 Units by mouth daily., Disp: , Rfl:    topiramate (TOPAMAX) 100 MG tablet, Take 1 tablet by mouth at bedtime., Disp: , Rfl:    TOSYMRA 10 MG/ACT SOLN, Place 1 spray into both nostrils daily as needed for migraine., Disp: , Rfl:    triamcinolone cream (KENALOG) 0.1 %, APPLY A TINY AMOUNT TO AFFECTED AREA TOPICALLY TWICE DAILY, Disp: 30 g, Rfl: 3  Current Facility-Administered Medications:    triamcinolone acetonide (KENALOG) 10 MG/ML injection 10 mg, 10 mg, Other, Once, Regal, Kirstie Peri, DPM  Social History   Tobacco Use  Smoking Status Never  Smokeless Tobacco Never    Allergies  Allergen Reactions   Sulfa Antibiotics    Penicillins Rash   Objective:  There were no vitals filed for this visit. There is no height or weight on file to calculate BMI. Constitutional Well developed. Well nourished.  Vascular Foot warm and well perfused. Capillary refill normal to all digits.   Neurologic Normal speech. Oriented to person, place, and time. Epicritic sensation to light touch grossly present bilaterally.  Dermatologic Skin healing well without signs of infection. Skin edges well coapted without signs of infection.  Orthopedic: Tenderness to palpation noted about the surgical site.   Assessment:  No diagnosis found. Plan:  Patient was evaluated and treated and all questions answered.  S/p foot surgery right -Progressing as expected post-operatively. -WB Status: WBAT  in  regular shoe -Sutures: removed today without incident. -Medications: n/a -Foot redressed.  Follow-up 3 weeks for post-op visit with Regal.   No follow-ups on file.

## 2023-03-27 ENCOUNTER — Encounter: Payer: Self-pay | Admitting: Podiatry

## 2023-03-27 ENCOUNTER — Ambulatory Visit (INDEPENDENT_AMBULATORY_CARE_PROVIDER_SITE_OTHER): Payer: 59 | Admitting: Podiatry

## 2023-03-27 DIAGNOSIS — M722 Plantar fascial fibromatosis: Secondary | ICD-10-CM

## 2023-03-27 MED ORDER — DICLOFENAC SODIUM 75 MG PO TBEC: 75.0000 mg | DELAYED_RELEASE_TABLET | Freq: Two times a day (BID) | ORAL | 2 refills | Status: DC

## 2023-03-27 NOTE — Progress Notes (Signed)
Subjective:   Patient ID: [name] [name], female   DOB: 42 y.o.   MRN: [id number]   HPI Patient states doing pretty well but still has some pain and states the swelling has really reduced    ROS      Objective:  Physical Exam  Neurovascular status intact negative Denna Haggard' sign noted right heel healing well mild edema swelling and pain consistent with this.  Postop     Assessment:  Doing fine still having mild discomfort but improving     Plan:  Placed on diclofenac currently and advised this patient on continued ice therapy using her night splint and boot as needed and this should gradually get better but may take another couple months to get completely better

## 2023-05-04 ENCOUNTER — Telehealth: Payer: Self-pay | Admitting: Family Medicine

## 2023-05-04 MED ORDER — TRIAMCINOLONE ACETONIDE 0.1 % EX CREA: TOPICAL_CREAM | CUTANEOUS | 1 refills | Status: DC

## 2023-05-04 NOTE — Telephone Encounter (Signed)
Prescription Request  05/04/2023  LOV: 08/09/2022  What is the name of the medication or equipment? triamcinolone cream (KENALOG) 0.1 %   Have you contacted your pharmacy to request a refill? Yes   Which pharmacy would you like this sent to?   [facility] [id number] [place], [place] - [address] [place] [address] Phone: [phone] Fax: [phone]    Patient notified that their request is being sent to the clinical staff for review and that they should receive a response within 2 business days.   Please advise at Mobile [phone] (mobile)

## 2023-05-04 NOTE — Telephone Encounter (Signed)
Last filled in 2021,  CPE was 08/09/22

## 2023-05-04 NOTE — Telephone Encounter (Signed)
sent 

## 2023-06-26 LAB — HM MAMMOGRAPHY

## 2023-06-28 ENCOUNTER — Ambulatory Visit: Payer: 59 | Admitting: Podiatry

## 2023-06-28 DIAGNOSIS — M722 Plantar fascial fibromatosis: Secondary | ICD-10-CM | POA: Diagnosis not present

## 2023-06-28 LAB — HM PAP SMEAR
HM Pap smear: NEGATIVE
HPV, high-risk: NEGATIVE

## 2023-06-28 MED ORDER — PREDNISONE 10 MG PO TABS: ORAL_TABLET | ORAL | 0 refills | Status: DC

## 2023-06-28 MED ORDER — TRIAMCINOLONE ACETONIDE 10 MG/ML IJ SUSP
10.0000 mg | Freq: Once | INTRAMUSCULAR | Status: AC
  Administered 2023-06-28: 10 mg via INTRA_ARTICULAR

## 2023-06-28 NOTE — Progress Notes (Signed)
Subjective:   Patient ID: [name] [name], female   DOB: 42 y.o.   MRN: [id number]   HPI Patient presents stating that she is still getting a lot of pain in her right heel and it seems to be more directly on the heel than arch or other areas   ROS      Objective:  Physical Exam  Neurovascular status with the patient who has had a frustrating several year course of chronic fascial inflammation with release of the fascia which has improved the more distal symptoms but she has more pain now proximal     Assessment:  Inflammatory fasciitis right inflammation fluid more proximal in the actual heel bone itself     Plan:  H&P reviewed at great length were going to modify orthotics and lift to try to take pressure off the heel I did a more proximal injection 3 mg dexamethasone Kenalog 5 mg Xylocaine I placed on a 12-day Sterapred DS Dosepak to use night splint and I want to see back again in 3 weeks

## 2023-07-19 ENCOUNTER — Encounter: Payer: Self-pay | Admitting: Podiatry

## 2023-07-19 ENCOUNTER — Ambulatory Visit: Payer: 59 | Admitting: Podiatry

## 2023-07-19 DIAGNOSIS — M722 Plantar fascial fibromatosis: Secondary | ICD-10-CM | POA: Diagnosis not present

## 2023-07-19 NOTE — Progress Notes (Signed)
Subjective:   Patient ID: [name] [name], female   DOB: 42 y.o.   MRN: [id number]   HPI Patient states that she is improved feeling a lot better neuro   ROS      Objective:  Physical Exam  Vascular status intact significant improvement in mid arch pain right with only minimal inflammation     Assessment:  Doing well from treatment of mid arch fascial inflammation right     Plan:  H&P done advised on continued night splint usage ice discharge reappoint as needed

## 2023-08-07 ENCOUNTER — Telehealth: Payer: Self-pay | Admitting: Family Medicine

## 2023-08-07 DIAGNOSIS — E559 Vitamin D deficiency, unspecified: Secondary | ICD-10-CM

## 2023-08-07 DIAGNOSIS — Z0000 Encounter for general adult medical examination without abnormal findings: Secondary | ICD-10-CM

## 2023-08-07 NOTE — Telephone Encounter (Signed)
-----   Message from Lovena Neighbours sent at 07/25/2023 11:09 AM EDT ----- Regarding: Fasting labs for 9.10.24 Please put physical lab orders in future. Thank you, Denny Peon

## 2023-08-08 ENCOUNTER — Other Ambulatory Visit (INDEPENDENT_AMBULATORY_CARE_PROVIDER_SITE_OTHER): Payer: 59

## 2023-08-08 DIAGNOSIS — Z0000 Encounter for general adult medical examination without abnormal findings: Secondary | ICD-10-CM

## 2023-08-08 DIAGNOSIS — E559 Vitamin D deficiency, unspecified: Secondary | ICD-10-CM | POA: Diagnosis not present

## 2023-08-08 LAB — CBC WITH DIFFERENTIAL/PLATELET
Basophils Absolute: 0.1 10*3/uL (ref 0.0–0.1)
Basophils Relative: 0.7 % (ref 0.0–3.0)
Eosinophils Absolute: 0.2 10*3/uL (ref 0.0–0.7)
Eosinophils Relative: 2 % (ref 0.0–5.0)
HCT: 41.7 % (ref 36.0–46.0)
Hemoglobin: 13.4 g/dL (ref 12.0–15.0)
Lymphocytes Relative: 21 % (ref 12.0–46.0)
Lymphs Abs: 1.9 10*3/uL (ref 0.7–4.0)
MCHC: 32 g/dL (ref 30.0–36.0)
MCV: 89.8 fl (ref 78.0–100.0)
Monocytes Absolute: 0.6 10*3/uL (ref 0.1–1.0)
Monocytes Relative: 6.9 % (ref 3.0–12.0)
Neutro Abs: 6.1 10*3/uL (ref 1.4–7.7)
Neutrophils Relative %: 69.4 % (ref 43.0–77.0)
Platelets: 255 10*3/uL (ref 150.0–400.0)
RBC: 4.65 Mil/uL (ref 3.87–5.11)
RDW: 14.1 % (ref 11.5–15.5)
WBC: 8.8 10*3/uL (ref 4.0–10.5)

## 2023-08-08 LAB — COMPREHENSIVE METABOLIC PANEL
ALT: 40 U/L — ABNORMAL HIGH (ref 0–35)
AST: 28 U/L (ref 0–37)
Albumin: 4 g/dL (ref 3.5–5.2)
Alkaline Phosphatase: 98 U/L (ref 39–117)
BUN: 16 mg/dL (ref 6–23)
CO2: 25 meq/L (ref 19–32)
Calcium: 9 mg/dL (ref 8.4–10.5)
Chloride: 107 meq/L (ref 96–112)
Creatinine, Ser: 0.98 mg/dL (ref 0.40–1.20)
GFR: 71.33 mL/min (ref >60.00)
Glucose, Bld: 97 mg/dL (ref 70–99)
Potassium: 4.2 meq/L (ref 3.5–5.1)
Sodium: 139 meq/L (ref 135–145)
Total Bilirubin: 0.4 mg/dL (ref 0.2–1.2)
Total Protein: 7 g/dL (ref 6.0–8.3)

## 2023-08-08 LAB — VITAMIN D 25 HYDROXY (VIT D DEFICIENCY, FRACTURES): VITD: 27.6 ng/mL — ABNORMAL LOW (ref 30.00–100.00)

## 2023-08-08 LAB — LIPID PANEL
Cholesterol: 158 mg/dL (ref 0–200)
HDL: 45.2 mg/dL (ref >39.00)
LDL Cholesterol: 88 mg/dL (ref 0–99)
NonHDL: 112.58
Total CHOL/HDL Ratio: 3
Triglycerides: 122 mg/dL (ref 0.0–149.0)
VLDL: 24.4 mg/dL (ref 0.0–40.0)

## 2023-08-08 LAB — TSH: TSH: 5.88 u[IU]/mL — ABNORMAL HIGH (ref 0.35–5.50)

## 2023-08-15 ENCOUNTER — Encounter: Payer: Self-pay | Admitting: Family Medicine

## 2023-08-15 ENCOUNTER — Ambulatory Visit (INDEPENDENT_AMBULATORY_CARE_PROVIDER_SITE_OTHER): Payer: 59 | Admitting: Family Medicine

## 2023-08-15 VITALS — BP 122/78 | HR 76 | Temp 97.8°F | Ht 68.0 in | Wt 224.0 lb

## 2023-08-15 DIAGNOSIS — R7989 Other specified abnormal findings of blood chemistry: Secondary | ICD-10-CM | POA: Insufficient documentation

## 2023-08-15 DIAGNOSIS — E559 Vitamin D deficiency, unspecified: Secondary | ICD-10-CM | POA: Diagnosis not present

## 2023-08-15 DIAGNOSIS — Z23 Encounter for immunization: Secondary | ICD-10-CM

## 2023-08-15 DIAGNOSIS — Z0000 Encounter for general adult medical examination without abnormal findings: Secondary | ICD-10-CM

## 2023-08-15 DIAGNOSIS — R7401 Elevation of levels of liver transaminase levels: Secondary | ICD-10-CM | POA: Diagnosis not present

## 2023-08-15 DIAGNOSIS — E669 Obesity, unspecified: Secondary | ICD-10-CM

## 2023-08-15 DIAGNOSIS — E039 Hypothyroidism, unspecified: Secondary | ICD-10-CM | POA: Insufficient documentation

## 2023-08-15 NOTE — Patient Instructions (Addendum)
Look for chair exercise programs Add some strength training to your routine, this is important for bone and brain health and can reduce your risk of falls and help your body use insulin properly and regulate weight  Light weights, exercise bands , and internet videos are a good way to start  Yoga (chair or regular), machines , floor exercises or a gym with machines are also good options   Get vitamin D3 over the counter and take 2000 international units daily   I want to watch your thyroid  Schedule labs for about a month  Also liver test Continue to avoid tylenol or alcohol   Take care of yourself   Try to get most of your carbohydrates from produce (with the exception of white potatoes) and whole grains Eat less bread/pasta/rice/snack foods/cereals/sweets and other items from the middle of the grocery store (processed carbs)

## 2023-08-15 NOTE — Assessment & Plan Note (Signed)
Last vitamin D Lab Results  Component Value Date   VD25OH 27.60 (L) 08/08/2023   Encouraged to start 2000 international units vit D3 daily  For bone and overall health

## 2023-08-15 NOTE — Assessment & Plan Note (Signed)
Discussed how this problem influences overall health and the risks it imposes  Reviewed plan for weight loss with lower calorie diet (via better food choices (lower glycemic and portion control) along with exercise building up to or more than 30 minutes 5 days per week including some aerobic activity and strength training

## 2023-08-15 NOTE — Assessment & Plan Note (Signed)
Lab Results  Component Value Date   TSH 5.88 (H) 08/08/2023   No clinical changes   Re check 1 mo with FT4 Daughter is hypothyroid

## 2023-08-15 NOTE — Assessment & Plan Note (Signed)
Reviewed health habits including diet and exercise and skin cancer prevention Reviewed appropriate screening tests for age  Also reviewed health mt list, fam hx and immunization status , as well as social and family history   See HPI Labs reviewed and ordered Flu shot given Sent for last mammo and pap from gyn this summer  Discussed fall prevention, supplements and exercise for bone density   Encouraged starting vitamin D PHQ 0

## 2023-08-15 NOTE — Assessment & Plan Note (Addendum)
ALT is mildly elevated  No symptoms  No acetaminophen or etoh May be fatty liver  Encouraged to work on weight loss  Re check in a mo Consider [place] if still elevated

## 2023-08-15 NOTE — Progress Notes (Signed)
Subjective:    Patient ID: [name] [name], female    DOB: 20-Aug-1981, 42 y.o.   MRN: [id number]  HPI  Here for health maintenance exam and to review chronic medical problems   Wt Readings from Last 3 Encounters:  08/15/23 224 lb (101.6 kg)  08/09/22 216 lb 2 oz (98 kg)  09/07/20 204 lb 5 oz (92.7 kg)   34.06 kg/m  Vitals:   08/15/23 0933  BP: 122/78  Pulse: 76  Temp: 97.8 F (36.6 C)  SpO2: 100%    Immunization History  Administered Date(s) Administered   Influenza, Seasonal, Injecte, Preservative Fre 08/15/2023   Influenza,inj,Quad PF,6+ Mos 01/06/2015, 09/16/2015, 07/27/2016, 08/29/2017, 09/03/2018, 09/06/2019, 09/07/2020, 08/09/2022   PFIZER(Purple Top)SARS-COV-2 Vaccination 02/29/2020, 03/24/2020   Tdap 07/27/2016    Health Maintenance Due  Topic Date Due   Cervical Cancer Screening (HPV/Pap Cotest)  04/30/2021   MAMMOGRAM  06/09/2023   Feeling pretty good   Trying to take care of herself  Has surgery for plantar fasciitis - still recovering  Sees Dr Abel Presto had steroid shots and prednisone    Flu shot -today   Mammogram- sent for last one [facility] Had it this summer Self breast exam-no lumps   Gyn health-sees gyn Sent for last pap  Had visit this summer    Colon cancer screening -will discuss at age 63 No colon cancer or breast cancer in family    Bone health  Has been on steroid  Falls- none  Fractures-none  Supplements  Last vitamin D Lab Results  Component Value Date   VD25OH 27.60 (L) 08/08/2023   Not taking vit D   Exercise -limited by foot surgery     Mood    08/15/2023    9:38 AM 08/09/2022    3:29 PM 09/07/2020    9:50 AM 09/06/2019    9:28 AM 09/03/2018    9:38 AM  Depression screen PHQ 2/9  Decreased Interest 0 0 0 0 0  Down, Depressed, Hopeless 0 0 0 0 0  PHQ - 2 Score 0 0 0 0 0  Altered sleeping 0 0 1 0   Tired, decreased energy 0 0 0 0   Change in appetite 0 0 0 0   Feeling bad or failure about  yourself  0 0 0 0   Trouble concentrating 0 0 0 0   Moving slowly or fidgety/restless 0 0 0 0   Suicidal thoughts 0 0 0 0   PHQ-9 Score 0 0 1 0   Difficult doing work/chores Not difficult at all Not difficult at all Not difficult at all Not difficult at all    TSH was increased this draw  Lab Results  Component Value Date   TSH 5.88 (H) 08/08/2023   Elevated once before years ago  Tired from stress  No goiter that she can tell   Daughter is hypothyroid since age 66    Liver  Lab Results  Component Value Date   ALT 40 (H) 08/08/2023   AST 28 08/08/2023   ALKPHOS 98 08/08/2023   BILITOT 0.4 08/08/2023   ALT has been mildly elevated in past  No etoh  Avoid tylenol    Cholesterol Lab Results  Component Value Date   CHOL 158 08/08/2023   CHOL 154 08/03/2022   CHOL 151 09/07/2020   Lab Results  Component Value Date   HDL 45.20 08/08/2023   HDL 49.60 08/03/2022   HDL 44.80 09/07/2020  Lab Results  Component Value Date   LDLCALC 88 08/08/2023   LDLCALC 83 08/03/2022   LDLCALC 82 09/07/2020   Lab Results  Component Value Date   TRIG 122.0 08/08/2023   TRIG 110.0 08/03/2022   TRIG 120.0 09/07/2020   Lab Results  Component Value Date   CHOLHDL 3 08/08/2023   CHOLHDL 3 08/03/2022   CHOLHDL 3 09/07/2020   No results found for: "LDLDIRECT"     Patient Active Problem List   Diagnosis Date Noted   Plantar fasciitis 08/09/2022   Elevated transaminase level 09/21/2020   Vitamin D deficiency 09/07/2020   Obesity (BMI 30-39.9) 09/07/2020   Rosacea 01/06/2015   Routine general medical examination at a health care facility 01/06/2015   Migraine 01/06/2015   Past Medical History:  Diagnosis Date   Migraine    Rosacea    Past Surgical History:  Procedure Laterality Date   BICEPS TENDON REPAIR  2021   murphy wainer   CESAREAN SECTION  2007   GALLBLADDER SURGERY  2007   Social History   Tobacco Use   Smoking status: Never   Smokeless tobacco: Never   Substance Use Topics   Alcohol use: No    Alcohol/week: 0.0 standard drinks of alcohol   Drug use: No   Family History  Problem Relation Age of Onset   Cancer Maternal Grandmother    Allergies  Allergen Reactions   Sulfa Antibiotics    Penicillins Rash   Current Outpatient Medications on File Prior to Visit  Medication Sig Dispense Refill   topiramate (TOPAMAX) 100 MG tablet Take 1 tablet by mouth at bedtime.     TOSYMRA 10 MG/ACT SOLN Place 1 spray into both nostrils daily as needed for migraine.     triamcinolone cream (KENALOG) 0.1 % APPLY A TINY AMOUNT TO AFFECTED AREA TOPICALLY TWICE DAILY 30 g 1   Current Facility-Administered Medications on File Prior to Visit  Medication Dose Route Frequency Provider Last Rate Last Admin   triamcinolone acetonide (KENALOG) 10 MG/ML injection 10 mg  10 mg Other Once Lenn Sink, DPM        Review of Systems  Constitutional:  Negative for activity change, appetite change, fatigue, fever and unexpected weight change.  HENT:  Negative for congestion, ear pain, rhinorrhea, sinus pressure and sore throat.   Eyes:  Negative for pain, redness and visual disturbance.  Respiratory:  Negative for cough, shortness of breath and wheezing.   Cardiovascular:  Negative for chest pain and palpitations.  Gastrointestinal:  Negative for abdominal pain, blood in stool, constipation and diarrhea.  Endocrine: Negative for polydipsia and polyuria.  Genitourinary:  Negative for dysuria, frequency and urgency.  Musculoskeletal:  Negative for arthralgias, back pain and myalgias.       Foot pain  Skin:  Negative for pallor and rash.  Allergic/Immunologic: Negative for environmental allergies.  Neurological:  Negative for dizziness, syncope and headaches.  Hematological:  Negative for adenopathy. Does not bruise/bleed easily.  Psychiatric/Behavioral:  Negative for decreased concentration and dysphoric mood. The patient is not nervous/anxious.         Objective:   Physical Exam Constitutional:      General: She is not in acute distress.    Appearance: Normal appearance. She is well-developed. She is obese. She is not ill-appearing or diaphoretic.  HENT:     Head: Normocephalic and atraumatic.     Right Ear: Tympanic membrane, ear canal and external ear normal.     Left Ear:  Tympanic membrane, ear canal and external ear normal.     Nose: Nose normal. No congestion.     Mouth/Throat:     Mouth: Mucous membranes are moist.     Pharynx: Oropharynx is clear. No posterior oropharyngeal erythema.  Eyes:     General: No scleral icterus.    Extraocular Movements: Extraocular movements intact.     Conjunctiva/sclera: Conjunctivae normal.     Pupils: Pupils are equal, round, and reactive to light.  Neck:     Thyroid: No thyromegaly.     Vascular: No carotid bruit or JVD.  Cardiovascular:     Rate and Rhythm: Normal rate and regular rhythm.     Pulses: Normal pulses.     Heart sounds: Normal heart sounds.     No gallop.  Pulmonary:     Effort: Pulmonary effort is normal. No respiratory distress.     Breath sounds: Normal breath sounds. No wheezing.     Comments: Good air exch Chest:     Chest wall: No tenderness.  Abdominal:     General: Bowel sounds are normal. There is no distension or abdominal bruit.     Palpations: Abdomen is soft. There is no mass.     Tenderness: There is no abdominal tenderness.     Hernia: No hernia is present.  Genitourinary:    Comments: Breast and pelvic exam are done by gyn provider   Musculoskeletal:        General: No tenderness. Normal range of motion.     Cervical back: Normal range of motion and neck supple. No rigidity. No muscular tenderness.     Right lower leg: No edema.     Left lower leg: No edema.     Comments: No kyphosis   Lymphadenopathy:     Cervical: No cervical adenopathy.  Skin:    General: Skin is warm and dry.     Coloration: Skin is not pale.     Findings: No erythema or  rash.     Comments: Solar lentigines diffusely   Neurological:     Mental Status: She is alert. Mental status is at baseline.     Cranial Nerves: No cranial nerve deficit.     Motor: No abnormal muscle tone.     Coordination: Coordination normal.     Gait: Gait normal.     Deep Tendon Reflexes: Reflexes are normal and symmetric. Reflexes normal.  Psychiatric:        Mood and Affect: Mood normal.        Cognition and Memory: Cognition and memory normal.           Assessment & Plan:   Problem List Items Addressed This Visit       Other   Elevated transaminase level    ALT is mildly elevated  No symptoms  No acetaminophen or etoh May be fatty liver  Encouraged to work on weight loss  Re check in a mo Consider [place] if still elevated       Obesity (BMI 30-39.9)    Discussed how this problem influences overall health and the risks it imposes  Reviewed plan for weight loss with lower calorie diet (via better food choices (lower glycemic and portion control) along with exercise building up to or more than 30 minutes 5 days per week including some aerobic activity and strength training         Routine general medical examination at a health care facility - Primary  Reviewed health habits including diet and exercise and skin cancer prevention Reviewed appropriate screening tests for age  Also reviewed health mt list, fam hx and immunization status , as well as social and family history   See HPI Labs reviewed and ordered Flu shot given Sent for last mammo and pap from gyn this summer  Discussed fall prevention, supplements and exercise for bone density   Encouraged starting vitamin D PHQ 0      Vitamin D deficiency   Other Visit Diagnoses     Need for influenza vaccination       Relevant Orders   Flu vaccine trivalent PF, 6mos and older(Flulaval,Afluria,Fluarix,Fluzone) (Completed)

## 2023-09-14 ENCOUNTER — Other Ambulatory Visit (INDEPENDENT_AMBULATORY_CARE_PROVIDER_SITE_OTHER): Payer: 59

## 2023-09-14 ENCOUNTER — Telehealth: Payer: Self-pay | Admitting: *Deleted

## 2023-09-14 ENCOUNTER — Encounter: Payer: Self-pay | Admitting: Family Medicine

## 2023-09-14 DIAGNOSIS — R7989 Other specified abnormal findings of blood chemistry: Secondary | ICD-10-CM | POA: Diagnosis not present

## 2023-09-14 DIAGNOSIS — R7401 Elevation of levels of liver transaminase levels: Secondary | ICD-10-CM | POA: Diagnosis not present

## 2023-09-14 LAB — T4, FREE: Free T4: 0.62 ng/dL (ref 0.60–1.60)

## 2023-09-14 LAB — HEPATIC FUNCTION PANEL
ALT: 15 U/L (ref 0–35)
AST: 16 U/L (ref 0–37)
Albumin: 4 g/dL (ref 3.5–5.2)
Alkaline Phosphatase: 86 U/L (ref 39–117)
Bilirubin, Direct: 0.1 mg/dL (ref 0.0–0.3)
Total Bilirubin: 0.4 mg/dL (ref 0.2–1.2)
Total Protein: 6.8 g/dL (ref 6.0–8.3)

## 2023-09-14 LAB — TSH: TSH: 6.48 u[IU]/mL — ABNORMAL HIGH (ref 0.35–5.50)

## 2023-09-14 MED ORDER — LEVOTHYROXINE SODIUM 25 MCG PO TABS: 25.0000 ug | ORAL_TABLET | Freq: Every day | ORAL | 0 refills | Status: DC

## 2023-09-14 NOTE — Telephone Encounter (Signed)
Pt notified of lab results and Dr. Royden Purl comments. Rx sent to pharmacy and f/u lab appt scheduled

## 2023-09-14 NOTE — Telephone Encounter (Signed)
-----   Message from [place] sent at 09/14/2023  2:08 PM EDT ----- Your TSH is still elevated and free T4 is lower now  I think you have hypothyroidism  This can cause fatigue and skin and hair changes I want to have you start levothyroxine 25 mcg one pill daily in am (30 or more minutes before food / other beverages or vitamins  Please send in 30 with 1 refill to her pharmacy  Will re check this in about 6 weeks

## 2023-09-22 ENCOUNTER — Telehealth: Payer: Self-pay | Admitting: Family Medicine

## 2023-09-22 NOTE — Telephone Encounter (Signed)
Patient dropped off document DMV, to be filled out by provider. Patient requested to send it back via Call Patient to pick up within 5-days. Document is located in providers tray at front office.Please advise at Mobile [phone] (mobile)

## 2023-09-22 NOTE — Telephone Encounter (Signed)
In your inbox for review

## 2023-09-25 NOTE — Telephone Encounter (Signed)
Pt notified form ready for pick up, copy sent to scanning and pt's placed at front desk

## 2023-09-25 NOTE — Telephone Encounter (Signed)
Form for tinted windows for migraine Done and in IN box

## 2023-09-26 NOTE — Telephone Encounter (Signed)
Patient's husband picked up forms.

## 2023-10-29 ENCOUNTER — Telehealth: Payer: Self-pay | Admitting: Family Medicine

## 2023-10-29 DIAGNOSIS — E039 Hypothyroidism, unspecified: Secondary | ICD-10-CM

## 2023-10-29 NOTE — Telephone Encounter (Signed)
-----   Message from Lovena Neighbours sent at 10/11/2023  2:29 PM EST ----- Regarding: Labs for Tuesday 12.3.24 Please put thyroid lab orders in future. Thank you, Denny Peon

## 2023-10-31 ENCOUNTER — Other Ambulatory Visit (INDEPENDENT_AMBULATORY_CARE_PROVIDER_SITE_OTHER): Payer: 59

## 2023-10-31 DIAGNOSIS — E039 Hypothyroidism, unspecified: Secondary | ICD-10-CM

## 2023-10-31 LAB — TSH: TSH: 4.32 u[IU]/mL (ref 0.35–5.50)

## 2023-11-02 ENCOUNTER — Other Ambulatory Visit: Payer: Self-pay | Admitting: *Deleted

## 2023-11-02 ENCOUNTER — Encounter: Payer: Self-pay | Admitting: *Deleted

## 2023-11-02 MED ORDER — LEVOTHYROXINE SODIUM 25 MCG PO TABS: 25.0000 ug | ORAL_TABLET | Freq: Every day | ORAL | 3 refills | Status: DC

## 2024-04-05 ENCOUNTER — Encounter (HOSPITAL_COMMUNITY): Payer: Self-pay

## 2024-04-12 ENCOUNTER — Encounter: Payer: Self-pay | Admitting: Family Medicine

## 2024-04-12 ENCOUNTER — Ambulatory Visit: Admitting: Family Medicine

## 2024-04-12 VITALS — BP 116/82 | HR 76 | Temp 98.2°F | Ht 68.0 in | Wt 214.4 lb

## 2024-04-12 DIAGNOSIS — R202 Paresthesia of skin: Secondary | ICD-10-CM | POA: Diagnosis not present

## 2024-04-12 DIAGNOSIS — E669 Obesity, unspecified: Secondary | ICD-10-CM

## 2024-04-12 NOTE — Patient Instructions (Addendum)
 Stay active Non walking cardio-pedal or swim / water exercise are options   Add some strength training to your routine, this is important for bone and brain health and can reduce your risk of falls and help your body use insulin properly and regulate weight  Light weights, exercise bands , and internet videos are a good way to start  Yoga (chair or regular), machines , floor exercises or a gym with machines are also good options   Consider a brief time with a trainer   Call your insurance co  Find out what co morbidities you need to have a GLP-1 (zepbound, wegovy)  medicine covered (fatty liver, sleep apnea , high blood pressure , severely high cholesterol are examples)    Consider joining weight watchers or noom programs- they do have practitioners that work with these medication   [facility] is a good idea also with [facility]- I can do a referral   Try to get most of your carbohydrates from produce (with the exception of white potatoes) and whole grains Eat less bread/pasta/rice/snack foods/cereals/sweets and other items from the middle of the grocery store (processed carbs)  For your left hand  Wear a wrist splint at night to decrease pressure on the nerve  Call you ortho/hand practitioner next  ? If the pinched nerve is at the wrist or elbow

## 2024-04-12 NOTE — Progress Notes (Signed)
 Subjective:    Patient ID: [name] [name], female    DOB: 12/18/1980, 43 y.o.   MRN: [id number]  HPI  Wt Readings from Last 3 Encounters:  04/12/24 214 lb 6 oz (97.2 kg)  08/15/23 224 lb (101.6 kg)  08/09/22 216 lb 2 oz (98 kg)   32.60 kg/m  Vitals:   04/12/24 0753  BP: 116/82  Pulse: 76  Temp: 98.2 F (36.8 C)  SpO2: 100%    Pt presents for c/o  Tingling in hands/arms Discuss weight / obesity   Weight  Not very active Sedentary lifestyle  Ever since multiple surgeries -cannot walk without foot pain   Uses some dumb bells  May be able to use a bike  May be able to do water exercise   Cut out soda/tea for the most part  Drinks water or diet drinks   Feels like she is never full Eats good and bad things both/does crave carbs   Husb is diabetic so tries to eat well at home  He is on mounjaro and it has helped weight loss    Already on topamax/no change in appetite    History of cts right hand   Left hand 4,5th finger are starting to bother her  Feel tingling/numb  Radiating up to arm    Lab Results  Component Value Date   TSH 4.32 10/31/2023   Levothy 25 mcg daily   Lab Results  Component Value Date   WBC 8.8 08/08/2023   HGB 13.4 08/08/2023   HCT 41.7 08/08/2023   MCV 89.8 08/08/2023   PLT 255.0 08/08/2023   No results found for: "VITAMINB12"  Lab Results  Component Value Date   ALT 15 09/14/2023   AST 16 09/14/2023   ALKPHOS 86 09/14/2023   BILITOT 0.4 09/14/2023    No results found for: "HGBA1C"    Patient Active Problem List   Diagnosis Date Noted   Left hand paresthesia 04/12/2024   Hypothyroid 08/15/2023   Plantar fasciitis 08/09/2022   Elevated transaminase level 09/21/2020   Vitamin D  deficiency 09/07/2020   Obesity (BMI 30-39.9) 09/07/2020   Rosacea 01/06/2015   Routine general medical examination at a health care facility 01/06/2015   Migraine 01/06/2015   Past Medical History:  Diagnosis Date   Migraine     Rosacea    Past Surgical History:  Procedure Laterality Date   BICEPS TENDON REPAIR  2021   murphy wainer   CESAREAN SECTION  2007   GALLBLADDER SURGERY  2007   Social History   Tobacco Use   Smoking status: Never   Smokeless tobacco: Never  Substance Use Topics   Alcohol use: No    Alcohol/week: 0.0 standard drinks of alcohol   Drug use: No   Family History  Problem Relation Age of Onset   Cancer Maternal Grandmother    Allergies  Allergen Reactions   Sulfa Antibiotics    Penicillins Rash   Current Outpatient Medications on File Prior to Visit  Medication Sig Dispense Refill   levothyroxine  (SYNTHROID ) 25 MCG tablet Take 1 tablet (25 mcg total) by mouth daily before breakfast. 90 tablet 3   topiramate (TOPAMAX) 100 MG tablet Take 1 tablet by mouth at bedtime.     TOSYMRA 10 MG/ACT SOLN Place 1 spray into both nostrils daily as needed for migraine.     triamcinolone  cream (KENALOG ) 0.1 % APPLY A TINY AMOUNT TO AFFECTED AREA TOPICALLY TWICE DAILY 30 g 1   Current Facility-Administered  Medications on File Prior to Visit  Medication Dose Route Frequency Provider Last Rate Last Admin   triamcinolone  acetonide (KENALOG ) 10 MG/ML injection 10 mg  10 mg Other Once Brandt Cake, DPM        Review of Systems  Constitutional:  Negative for activity change, appetite change, fatigue, fever and unexpected weight change.  HENT:  Negative for congestion, ear pain, rhinorrhea, sinus pressure and sore throat.   Eyes:  Negative for pain, redness and visual disturbance.  Respiratory:  Negative for cough, shortness of breath and wheezing.   Cardiovascular:  Negative for chest pain and palpitations.  Gastrointestinal:  Negative for abdominal pain, blood in stool, constipation and diarrhea.  Endocrine: Negative for polydipsia and polyuria.  Genitourinary:  Negative for dysuria, frequency and urgency.  Musculoskeletal:  Positive for arthralgias. Negative for back pain, joint swelling and  myalgias.  Skin:  Negative for pallor and rash.  Allergic/Immunologic: Negative for environmental allergies.  Neurological:  Positive for numbness. Negative for dizziness, syncope and headaches.  Hematological:  Negative for adenopathy. Does not bruise/bleed easily.  Psychiatric/Behavioral:  Negative for decreased concentration and dysphoric mood. The patient is not nervous/anxious.        Objective:   Physical Exam Constitutional:      General: She is not in acute distress.    Appearance: Normal appearance. She is obese. She is not ill-appearing or diaphoretic.  HENT:     Head: Normocephalic and atraumatic.  Eyes:     Conjunctiva/sclera: Conjunctivae normal.     Pupils: Pupils are equal, round, and reactive to light.  Cardiovascular:     Rate and Rhythm: Normal rate and regular rhythm.  Pulmonary:     Effort: Pulmonary effort is normal.  Musculoskeletal:     Comments: Some left ant wrist tenderness Some tenderness of ulnar groove on elbow also  Normal rom of wrist and elbow   Skin:    Comments: Rosacea on cheeks   Neurological:     Mental Status: She is alert.     Coordination: Coordination normal.     Comments: Positive tinel and phalen tests in left hand causing tingling in 4,5th fingers up to wrist Some tenderness in anterior wrist w/o swelling   Grip is affected somewhat    Psychiatric:        Mood and Affect: Mood normal.           Assessment & Plan:   Problem List Items Addressed This Visit       Other   Obesity (BMI 30-39.9) - Primary   Lack of time for self care plays a big role  Does have motivation  So does large appetite and cravings (not an emotional eater)  Needs to exercise more /do muscle building activity and has some limitations with ortho problems (feet /bicep) Cannot walk for cardio/has to do other things Topamax 100 mg does not curb appetite   Discussed options for non walking cardio  Would benefit from a trainer to teach safe muscle  building exercise   Interested in glp-1 meds to help with appetite Disc option of GLP medication including possible side effects like GI intolerance and risk of thyroid  and endocrine cancer, pancreatitis and gallstones, kidney problems and diabetic retinopathy  Discussed food choices/ avoidance of simple carbs and added sugars   Discussed programs like weight watchers/ no[facility] or the [facility]  Urged her to call her ins co/see what coverage is like and what co morbidities  she would need to have , what co pay would be  She will get back to us          Left hand paresthesia   In ulnar nerve distribution - 4,5th fingers  Some increase of symptoms with tinel /phalen tests 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

## 2024-04-12 NOTE — Assessment & Plan Note (Addendum)
 Lack of time for self care plays a big role  Does have motivation  So does large appetite and cravings (not an emotional eater)  Needs to exercise more /do muscle building activity and has some limitations with ortho problems (feet /bicep) Cannot walk for cardio/has to do other things Topamax 100 mg does not curb appetite   Discussed options for non walking cardio  Would benefit from a trainer to teach safe muscle building exercise   Interested in glp-1 meds to help with appetite Disc option of GLP medication including possible side effects like GI intolerance and risk of thyroid  and endocrine cancer, pancreatitis and gallstones, kidney problems and diabetic retinopathy  Discussed food choices/ avoidance of simple carbs and added sugars   Discussed programs like weight watchers/ no[facility] or [facility]  Urged her to call her ins co/see what coverage is like and what co morbidities she would need to have , what co pay would be  She will get back to us 

## 2024-04-12 NOTE — Assessment & Plan Note (Signed)
 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

## 2024-05-16 ENCOUNTER — Encounter: Payer: Self-pay | Admitting: Family Medicine

## 2024-05-16 ENCOUNTER — Telehealth: Payer: Self-pay

## 2024-05-16 ENCOUNTER — Other Ambulatory Visit (HOSPITAL_COMMUNITY): Payer: Self-pay

## 2024-05-16 MED ORDER — ZEPBOUND 2.5 MG/0.5ML ~~LOC~~ SOAJ: 2.5000 mg | SUBCUTANEOUS | 0 refills | Status: DC

## 2024-05-16 NOTE — Telephone Encounter (Signed)
 Pharmacy Patient Advocate Encounter   Received notification from CoverMyMeds that prior authorization for Zepbound 2.5MG /0.5ML pen-injectors  is required/requested.   Insurance verification completed.   The patient is insured through [facility] .   Per test claim: PA required; PA started via CoverMyMeds. KEY [id number] . Waiting for clinical questions to populate.

## 2024-05-16 NOTE — Telephone Encounter (Signed)
Please proceed with prior auth. Thanks

## 2024-05-17 NOTE — Telephone Encounter (Signed)
 PLEASE BE ADVISED Clinical questions have been answered and PA submitted.TO PLAN. PA currently Pending.

## 2024-05-19 ENCOUNTER — Other Ambulatory Visit: Payer: Self-pay | Admitting: Family Medicine

## 2024-05-20 NOTE — Telephone Encounter (Signed)
Left VM letting pharmacy know PA was approved

## 2024-05-20 NOTE — Telephone Encounter (Signed)
 Please let pt know if she does not already Thanks

## 2024-05-20 NOTE — Telephone Encounter (Signed)
 Last filled on 05/04/23 # 30 g/ 1 refill  CPE scheduled 08/16/24

## 2024-05-20 NOTE — Telephone Encounter (Signed)
 Pharmacy Patient Advocate Encounter  Received notification from [facility] that Prior Authorization for Zepbound  2.5MG /0.5ML pen-injectors has been APPROVED from 05/20/2024 to 11/16/2024   PA #/Case ID/Reference #: [id number]  Request Reference Number: [id number]. ZEPBOUND  INJ 2.5/0.5 is approved through 11/16/2024. Your patient may now fill this prescription and it will be covered.. Authorization Expiration Date: November 16, 2024.

## 2024-06-06 ENCOUNTER — Telehealth: Payer: Self-pay | Admitting: *Deleted

## 2024-06-06 MED ORDER — TIRZEPATIDE-WEIGHT MANAGEMENT 5 MG/0.5ML ~~LOC~~ SOAJ: 5.0000 mg | SUBCUTANEOUS | 0 refills | Status: DC

## 2024-06-06 NOTE — Telephone Encounter (Signed)
Sent mychart letting pt know Dr. Tower's comments. 

## 2024-06-06 NOTE — Telephone Encounter (Signed)
 Pt sent a message saying:  Just sending an update per your request. Started week 3 this week. No issues or side effects noticed. Should the dose be increased as it seems this is a starting dose? Thanks!

## 2024-06-06 NOTE — Telephone Encounter (Signed)
 I sent the 5 mg dose   Follow up in 2-4 weeks please for office visit to check in   Update if any problems

## 2024-06-13 ENCOUNTER — Encounter: Payer: Self-pay | Admitting: Family Medicine

## 2024-06-14 NOTE — Telephone Encounter (Signed)
 Please put in with first available for eval or UC if nothing is open   Thanks

## 2024-06-19 ENCOUNTER — Ambulatory Visit: Admitting: Primary Care

## 2024-06-19 ENCOUNTER — Encounter: Payer: Self-pay | Admitting: Primary Care

## 2024-06-19 VITALS — BP 122/64 | HR 77 | Temp 97.9°F | Ht 68.0 in | Wt 200.0 lb

## 2024-06-19 DIAGNOSIS — L720 Epidermal cyst: Secondary | ICD-10-CM | POA: Insufficient documentation

## 2024-06-19 NOTE — Progress Notes (Signed)
 Subjective:    Patient ID: [name] [name], female    DOB: 1981-03-24, 43 y.o.   MRN: [id number]  HPI  [name] [name] is a very pleasant 43 y.o. female patient of Dr. Randeen with a history of hypothyroidism, rosacea, migraines who presents today to discuss skin wound.  For the last 1 year she's noticed a cyst like mass to the left hip. She has been in the hospital with her daughter over the last few weeks, sleeping on a firm sofa bed. One week ago she mass increased in size, and a few days ago the skin mass burst, drained blood and puss with a residual whitish pocket within the wound. The wound area has closed up some. She's applied bandages and kept the wound clean.   She denies pain now, but it was painful after the mass popped. She also denies fevers, redness, additional drainage.    Review of Systems  Constitutional:  Negative for fever.  Skin:  Positive for color change and wound.         Past Medical History:  Diagnosis Date   Migraine    Rosacea     Social History   Socioeconomic History   Marital status: Married    Spouse name: Not on file   Number of children: Not on file   Years of education: Not on file   Highest education level: Not on file  Occupational History   Not on file  Tobacco Use   Smoking status: Never   Smokeless tobacco: Never  Substance and Sexual Activity   Alcohol use: No    Alcohol/week: 0.0 standard drinks of alcohol   Drug use: No   Sexual activity: Not on file  Other Topics Concern   Not on file  Social History Narrative   Works as Administrator at [employer]    Married    One daughter    Exercises at the gym   Social Drivers of Corporate investment banker Strain: Not on file  Food Insecurity: Not on file  Transportation Needs: Not on file  Physical Activity: Not on file  Stress: Not on file  Social Connections: Not on file  Intimate Partner Violence: Not on file    Past Surgical History:  Procedure Laterality Date    BICEPS TENDON REPAIR  2021   murphy wainer   CESAREAN SECTION  2007   GALLBLADDER SURGERY  2007    Family History  Problem Relation Age of Onset   Cancer Maternal Grandmother     Allergies  Allergen Reactions   Sulfa Antibiotics    Penicillins Rash    Current Outpatient Medications on File Prior to Visit  Medication Sig Dispense Refill   levothyroxine  (SYNTHROID ) 25 MCG tablet Take 1 tablet (25 mcg total) by mouth daily before breakfast. 90 tablet 3   tirzepatide  (ZEPBOUND ) 2.5 MG/0.5ML Pen Inject 2.5 mg into the skin once a week. 2 mL 0   tirzepatide  (ZEPBOUND ) 5 MG/0.5ML Pen Inject 5 mg into the skin once a week. 2 mL 0   topiramate (TOPAMAX) 100 MG tablet Take 1 tablet by mouth at bedtime.     TOSYMRA 10 MG/ACT SOLN Place 1 spray into both nostrils daily as needed for migraine.     triamcinolone  cream (KENALOG ) 0.1 % APPLY A TINY AMOUNT TO AFFECTED AREA OF SKIN TWO TIMES A DAY 30 g 0   Current Facility-Administered Medications on File Prior to Visit  Medication Dose Route Frequency Provider Last Rate  Last Admin   triamcinolone  acetonide (KENALOG ) 10 MG/ML injection 10 mg  10 mg Other Once Regal, Norman S, DPM        BP 122/64   Pulse 77   Temp 97.9 F (36.6 C) (Temporal)   Ht 5' 8 (1.727 m)   Wt 200 lb (90.7 kg)   SpO2 98%   BMI 30.41 kg/m  Objective:   Physical Exam Constitutional:      General: She is not in acute distress. Skin:    General: Skin is warm and dry.     Comments: 0.5 cm mild rubor area to left hip with 2 mm nearly closed opening without drainage. No surrounding erythema, no tenderness, no mass.   Neurological:     Mental Status: She is alert.           Assessment & Plan:  Ruptured epidermal cyst Assessment & Plan: Without complication on exam.   Reassurance provided.  Discussed s/s of infection. Continue to cover with bandage as site is along the pants line.  We will defer dermatology at this point, she will notify if anything  changes.          [name] [name] K [name] Amyx, NP

## 2024-06-19 NOTE — Assessment & Plan Note (Signed)
 Without complication on exam.   Reassurance provided.  Discussed s/s of infection. Continue to cover with bandage as site is along the pants line.  We will defer dermatology at this point, she will notify if anything changes.

## 2024-06-19 NOTE — Patient Instructions (Signed)
 Keep the site clean and dry.  Monitor for signs and symptoms of infection as discussed.  It was a pleasure meeting you!

## 2024-07-01 LAB — HM MAMMOGRAPHY

## 2024-07-08 ENCOUNTER — Encounter: Payer: Self-pay | Admitting: Family Medicine

## 2024-07-08 ENCOUNTER — Ambulatory Visit: Payer: Self-pay | Admitting: Family Medicine

## 2024-07-08 ENCOUNTER — Ambulatory Visit: Admitting: Family Medicine

## 2024-07-08 VITALS — BP 112/78 | HR 72 | Temp 98.2°F | Ht 68.0 in | Wt 193.0 lb

## 2024-07-08 DIAGNOSIS — E559 Vitamin D deficiency, unspecified: Secondary | ICD-10-CM | POA: Diagnosis not present

## 2024-07-08 DIAGNOSIS — Z0000 Encounter for general adult medical examination without abnormal findings: Secondary | ICD-10-CM

## 2024-07-08 DIAGNOSIS — E669 Obesity, unspecified: Secondary | ICD-10-CM

## 2024-07-08 DIAGNOSIS — E039 Hypothyroidism, unspecified: Secondary | ICD-10-CM | POA: Diagnosis not present

## 2024-07-08 DIAGNOSIS — R7401 Elevation of levels of liver transaminase levels: Secondary | ICD-10-CM

## 2024-07-08 LAB — CBC WITH DIFFERENTIAL/PLATELET
Basophils Absolute: 0 K/uL (ref 0.0–0.1)
Basophils Relative: 0.7 % (ref 0.0–3.0)
Eosinophils Absolute: 0.1 K/uL (ref 0.0–0.7)
Eosinophils Relative: 2.1 % (ref 0.0–5.0)
HCT: 42.5 % (ref 36.0–46.0)
Hemoglobin: 14 g/dL (ref 12.0–15.0)
Lymphocytes Relative: 17.9 % (ref 12.0–46.0)
Lymphs Abs: 1.3 K/uL (ref 0.7–4.0)
MCHC: 32.9 g/dL (ref 30.0–36.0)
MCV: 88.9 fl (ref 78.0–100.0)
Monocytes Absolute: 0.6 K/uL (ref 0.1–1.0)
Monocytes Relative: 8.6 % (ref 3.0–12.0)
Neutro Abs: 5.1 K/uL (ref 1.4–7.7)
Neutrophils Relative %: 70.7 % (ref 43.0–77.0)
Platelets: 167 K/uL (ref 150.0–400.0)
RBC: 4.79 Mil/uL (ref 3.87–5.11)
RDW: 14.1 % (ref 11.5–15.5)
WBC: 7.2 K/uL (ref 4.0–10.5)

## 2024-07-08 LAB — LIPID PANEL
Cholesterol: 124 mg/dL (ref 0–200)
HDL: 38.5 mg/dL — ABNORMAL LOW (ref >39.00)
LDL Cholesterol: 64 mg/dL (ref 0–99)
NonHDL: 85.9
Total CHOL/HDL Ratio: 3
Triglycerides: 108 mg/dL (ref 0.0–149.0)
VLDL: 21.6 mg/dL (ref 0.0–40.0)

## 2024-07-08 LAB — TSH: TSH: 2.89 u[IU]/mL (ref 0.35–5.50)

## 2024-07-08 LAB — VITAMIN D 25 HYDROXY (VIT D DEFICIENCY, FRACTURES): VITD: 52.82 ng/mL (ref 30.00–100.00)

## 2024-07-08 LAB — COMPREHENSIVE METABOLIC PANEL WITH GFR
ALT: 16 U/L (ref 0–35)
AST: 15 U/L (ref 0–37)
Albumin: 4.3 g/dL (ref 3.5–5.2)
Alkaline Phosphatase: 63 U/L (ref 39–117)
BUN: 14 mg/dL (ref 6–23)
CO2: 23 meq/L (ref 19–32)
Calcium: 9.3 mg/dL (ref 8.4–10.5)
Chloride: 107 meq/L (ref 96–112)
Creatinine, Ser: 0.94 mg/dL (ref 0.40–1.20)
GFR: 74.5 mL/min (ref >60.00)
Glucose, Bld: 84 mg/dL (ref 70–99)
Potassium: 3.9 meq/L (ref 3.5–5.1)
Sodium: 138 meq/L (ref 135–145)
Total Bilirubin: 0.5 mg/dL (ref 0.2–1.2)
Total Protein: 7 g/dL (ref 6.0–8.3)

## 2024-07-08 MED ORDER — TIRZEPATIDE-WEIGHT MANAGEMENT 7.5 MG/0.5ML ~~LOC~~ SOAJ: 7.5000 mg | SUBCUTANEOUS | 0 refills | Status: DC

## 2024-07-08 NOTE — Assessment & Plan Note (Signed)
 Hoping for improvement with weight loss today

## 2024-07-08 NOTE — Progress Notes (Signed)
 Subjective:    Patient ID: [name] [name], female    DOB: [birth year], 43 y.o.   MRN: [id number]  HPI  Wt Readings from Last 3 Encounters:  07/08/24 193 lb (87.5 kg)  06/19/24 200 lb (90.7 kg)  04/12/24 214 lb 6 oz (97.2 kg)   29.35 kg/m  Vitals:   07/08/24 0802  BP: 112/78  Pulse: 72  Temp: 98.2 F (36.8 C)  SpO2: 100%     Pt presents for follow up of obesity treatment with zepbound  In setting of likely fatty liver  Also to get her labs for upcoming annual exam next mo   Currently taking 5 mg weekly   Weight is down 21 lb (by her scale 22 lb)   Had one week of queasy feeling / otherwise doing fine  Tolerating it will   No longer craves sweets at all Even on vacation did not eat anything sweet  Appetite is up and down , gets full fast  Thinking about food less   Has to make herself grab food when she is not hungry   Lots of fruit  Veggies  Yogurt   Exercise  Using some dumb bells- using them for upper body   Daughter was in hospital twice -hard to find time  Just got her moved in D   Taking her vitamin D       Patient Active Problem List   Diagnosis Date Noted   Ruptured epidermal cyst 06/19/2024   Left hand paresthesia 04/12/2024   Hypothyroid 08/15/2023   Plantar fasciitis 08/09/2022   Elevated transaminase level 09/21/2020   Vitamin D  deficiency 09/07/2020   Obesity (BMI 30-39.9) 09/07/2020   Rosacea 01/06/2015   Routine general medical examination at a health care facility 01/06/2015   Migraine 01/06/2015   Past Medical History:  Diagnosis Date   Migraine    Rosacea    Past Surgical History:  Procedure Laterality Date   BICEPS TENDON REPAIR  2021   murphy wainer   CESAREAN SECTION  2007   GALLBLADDER SURGERY  2007   Social History   Tobacco Use   Smoking status: Never   Smokeless tobacco: Never  Substance Use Topics   Alcohol use: No    Alcohol/week: 0.0 standard drinks of alcohol   Drug use: No   Family History  Problem  Relation Age of Onset   Cancer Maternal Grandmother    Allergies  Allergen Reactions   Sulfa Antibiotics    Penicillins Rash   Current Outpatient Medications on File Prior to Visit  Medication Sig Dispense Refill   ANUSOL-HC 2.5 % rectal cream Place 1 Application rectally 2 (two) times daily.     doxycycline  (VIBRAMYCIN ) 100 MG capsule Take 100 mg by mouth daily.     levothyroxine  (SYNTHROID ) 25 MCG tablet Take 1 tablet (25 mcg total) by mouth daily before breakfast. 90 tablet 3   topiramate (TOPAMAX) 100 MG tablet Take 1 tablet by mouth at bedtime.     TOSYMRA 10 MG/ACT SOLN Place 1 spray into both nostrils daily as needed for migraine.     triamcinolone  cream (KENALOG ) 0.1 % APPLY A TINY AMOUNT TO AFFECTED AREA OF SKIN TWO TIMES A DAY 30 g 0   Current Facility-Administered Medications on File Prior to Visit  Medication Dose Route Frequency Provider Last Rate Last Admin   triamcinolone  acetonide (KENALOG ) 10 MG/ML injection 10 mg  10 mg Other Once Magdalen Pasco RAMAN, DPM        Review  of Systems  Constitutional:  Negative for activity change, appetite change, fatigue, fever and unexpected weight change.  HENT:  Negative for congestion, ear pain, rhinorrhea, sinus pressure and sore throat.   Eyes:  Negative for pain, redness and visual disturbance.  Respiratory:  Negative for cough, shortness of breath and wheezing.   Cardiovascular:  Negative for chest pain and palpitations.  Gastrointestinal:  Negative for abdominal pain, blood in stool, constipation and diarrhea.  Endocrine: Negative for polydipsia and polyuria.  Genitourinary:  Negative for dysuria, frequency and urgency.  Musculoskeletal:  Negative for arthralgias, back pain and myalgias.  Skin:  Negative for pallor and rash.  Allergic/Immunologic: Negative for environmental allergies.  Neurological:  Negative for dizziness, syncope and headaches.  Hematological:  Negative for adenopathy. Does not bruise/bleed easily.   Psychiatric/Behavioral:  Negative for decreased concentration and dysphoric mood. The patient is not nervous/anxious.        Stress- daughter was sick and hospitalized this summer Doing better Has left for college- worries about her        Objective:   Physical Exam Constitutional:      General: She is not in acute distress.    Appearance: Normal appearance. She is well-developed. She is obese. She is not ill-appearing or diaphoretic.     Comments: Weight loss noted   HENT:     Head: Normocephalic and atraumatic.  Eyes:     Conjunctiva/sclera: Conjunctivae normal.     Pupils: Pupils are equal, round, and reactive to light.  Neck:     Thyroid : No thyromegaly.     Vascular: No carotid bruit or JVD.  Cardiovascular:     Rate and Rhythm: Normal rate and regular rhythm.     Heart sounds: Normal heart sounds.     No gallop.  Pulmonary:     Effort: Pulmonary effort is normal. No respiratory distress.     Breath sounds: Normal breath sounds. No wheezing or rales.  Abdominal:     General: There is no distension or abdominal bruit.     Palpations: Abdomen is soft.  Musculoskeletal:     Cervical back: Normal range of motion and neck supple.     Right lower leg: No edema.     Left lower leg: No edema.  Lymphadenopathy:     Cervical: No cervical adenopathy.  Skin:    General: Skin is warm and dry.     Coloration: Skin is not pale.     Findings: No rash.  Neurological:     Mental Status: She is alert.     Coordination: Coordination normal.     Deep Tendon Reflexes: Reflexes are normal and symmetric. Reflexes normal.  Psychiatric:        Mood and Affect: Mood normal.           Assessment & Plan:   Problem List Items Addressed This Visit       Endocrine   Hypothyroid   TSH today  Levothyroxine  25 mcg  No clinical changes (lost weight from glp-1 med)   Review at upcoming health mt       Relevant Orders   TSH     Other   Vitamin D  deficiency   Lab  today Continues D for bone and overall health  If diet is balanced / may not need mvi  If calcium constipates can stop it       Relevant Orders   VITAMIN D  25 Hydroxy (Vit-D Deficiency, Fractures)   Routine general medical examination  at a health care facility   Relevant Orders   Comprehensive metabolic panel with GFR   CBC with Differential/Platelet   Lipid Panel   TSH   Obesity (BMI 30-39.9) - Primary   Doing well with zepbound  5 mg weekly  Lost 21-22 lb so far  Tolerating well   Discussed importance of protein intake  Discussed importance of muscle building exercise (plans to join gym now that daughter left for college)  Discussed how this problem influences overall health and the risks it imposes  Reviewed plan for weight loss with lower calorie diet (via better food choices (lower glycemic and portion control) along with exercise building up to or more than 30 minutes 5 days per week including some aerobic activity and strength training    Will go up to 7.5 mg weekly for zepbound  Instructed to call if problems or side effects Follow up next mo for annual exam /health mt (lab today)      Relevant Medications   tirzepatide  (ZEPBOUND ) 7.5 MG/0.5ML Pen   Elevated transaminase level   Hoping for improvement with weight loss today

## 2024-07-08 NOTE — Patient Instructions (Addendum)
 Add some strength training to your routine, this is important for bone and brain health and can reduce your risk of falls and help your body use insulin properly and regulate weight  Light weights, exercise bands , and internet videos are a good way to start  Yoga (chair or regular), machines , floor exercises or a gym with machines are also good options  Get in the gym or do it at home    Make sure to get protein with every meal The following are examples of protein in diet  Lots of options :  Meat - lean  Fish  Eggs  Dairy products  Soy products  Oat milk  Almond milk Nuts and nut butters  Legumes  Dried beans   Go up to the dose of zepbound  to 7.5 mg weekly  If problems let us  know   Labs today for your upcoming physical

## 2024-07-08 NOTE — Assessment & Plan Note (Signed)
 Lab today Continues D for bone and overall health  If diet is balanced / may not need mvi  If calcium constipates can stop it

## 2024-07-08 NOTE — Assessment & Plan Note (Addendum)
 TSH today  Levothyroxine  25 mcg  No clinical changes (lost weight from glp-1 med)   Review at upcoming health mt

## 2024-07-08 NOTE — Assessment & Plan Note (Signed)
 Doing well with zepbound  5 mg weekly  Lost 21-22 lb so far  Tolerating well   Discussed importance of protein intake  Discussed importance of muscle building exercise (plans to join gym now that daughter left for college)  Discussed how this problem influences overall health and the risks it imposes  Reviewed plan for weight loss with lower calorie diet (via better food choices (lower glycemic and portion control) along with exercise building up to or more than 30 minutes 5 days per week including some aerobic activity and strength training    Will go up to 7.5 mg weekly for zepbound  Instructed to call if problems or side effects Follow up next mo for annual exam /health mt (lab today)

## 2024-07-24 ENCOUNTER — Encounter: Payer: Self-pay | Admitting: Family Medicine

## 2024-08-09 ENCOUNTER — Other Ambulatory Visit

## 2024-08-16 ENCOUNTER — Ambulatory Visit (INDEPENDENT_AMBULATORY_CARE_PROVIDER_SITE_OTHER): Admitting: Family Medicine

## 2024-08-16 ENCOUNTER — Encounter: Payer: Self-pay | Admitting: Family Medicine

## 2024-08-16 VITALS — BP 104/70 | HR 71 | Temp 97.8°F | Ht 67.75 in | Wt 179.4 lb

## 2024-08-16 DIAGNOSIS — E039 Hypothyroidism, unspecified: Secondary | ICD-10-CM | POA: Diagnosis not present

## 2024-08-16 DIAGNOSIS — R7401 Elevation of levels of liver transaminase levels: Secondary | ICD-10-CM

## 2024-08-16 DIAGNOSIS — E669 Obesity, unspecified: Secondary | ICD-10-CM

## 2024-08-16 DIAGNOSIS — Z0000 Encounter for general adult medical examination without abnormal findings: Secondary | ICD-10-CM | POA: Diagnosis not present

## 2024-08-16 DIAGNOSIS — E559 Vitamin D deficiency, unspecified: Secondary | ICD-10-CM

## 2024-08-16 DIAGNOSIS — Z23 Encounter for immunization: Secondary | ICD-10-CM

## 2024-08-16 MED ORDER — TIRZEPATIDE-WEIGHT MANAGEMENT 7.5 MG/0.5ML ~~LOC~~ SOAJ: 7.5000 mg | SUBCUTANEOUS | 5 refills | Status: DC

## 2024-08-16 MED ORDER — LEVOTHYROXINE SODIUM 25 MCG PO TABS: 25.0000 ug | ORAL_TABLET | Freq: Every day | ORAL | 3 refills | Status: DC

## 2024-08-16 NOTE — Patient Instructions (Addendum)
 Keep up the strength training at home  Add some strength training to your routine, this is important for bone and brain health and can reduce your risk of falls and help your body use insulin properly and regulate weight  Light weights, exercise bands , and internet videos are a good way to start  Yoga (chair or regular), machines , floor exercises or a gym with machines are also good options    Keep up the good work with diet/exercise    Continue current dose of zepbound  - we will stay here   Flu shot today

## 2024-08-16 NOTE — Assessment & Plan Note (Signed)
 Last vitamin D  Lab Results  Component Value Date   VD25OH 52.82 07/08/2024   Vitamin D  level is therapeutic with current supplementation Disc importance of this to bone and overall health

## 2024-08-16 NOTE — Assessment & Plan Note (Signed)
 Lab Results  Component Value Date   ALT 16 07/08/2024   AST 15 07/08/2024   ALKPHOS 63 07/08/2024   BILITOT 0.5 07/08/2024   Reassuring Improved with weight loss

## 2024-08-16 NOTE — Assessment & Plan Note (Signed)
 Bmi is down to 27.48 now  Lost over 30 lb so far with glp-1  Continues zepbound  7.5 mg weekly  Will stay at this dose as she did initially have nausea   Discussed how this problem influences overall health and the risks it imposes  Reviewed plan for weight loss with lower calorie diet (via better food choices (lower glycemic and portion control) along with exercise building up to or more than 30 minutes 5 days per week including some aerobic activity and strength training   Encouraged strongly to continue strength training

## 2024-08-16 NOTE — Progress Notes (Signed)
 Subjective:    Patient ID: [name] [name], female    DOB: 1981/10/07, 43 y.o.   MRN: [id number]  HPI  Here for health maintenance exam and to review chronic medical problems   Wt Readings from Last 3 Encounters:  08/16/24 179 lb 6 oz (81.4 kg)  07/08/24 193 lb (87.5 kg)  06/19/24 200 lb (90.7 kg)   27.48 kg/m  Vitals:   08/16/24 0831  BP: 104/70  Pulse: 71  Temp: 97.8 F (36.6 C)  SpO2: 100%    Immunization History  Administered Date(s) Administered   Influenza, Seasonal, Injecte, Preservative Fre 08/15/2023, 08/16/2024   Influenza,inj,Quad PF,6+ Mos 01/06/2015, 09/16/2015, 07/27/2016, 08/29/2017, 09/03/2018, 09/06/2019, 09/07/2020, 08/09/2022   PFIZER(Purple Top)SARS-COV-2 Vaccination 02/29/2020, 03/24/2020   Tdap 07/27/2016    Health Maintenance Due  Topic Date Due   HIV Screening  Never done   Hepatitis B Vaccines 19-59 Average Risk (1 of 3 - 19+ 3-dose series) Never done   HPV VACCINES (1 - 3-dose SCDM series) Never done   Flu shot today  Mammogram 06/2024 Self breast exam  Gyn health Pap 05/2023 Sees gyn    Colon cancer screening  No family history    Bone health   Falls-none  Fractures-none  Supplements  Last vitamin D  Lab Results  Component Value Date   VD25OH 52.82 07/08/2024    Exercise  Exercising at home  [employer] bells Crunches     Mood    08/16/2024    8:35 AM 07/08/2024    8:11 AM 04/12/2024    8:04 AM 08/15/2023    9:38 AM 08/09/2022    3:29 PM  Depression screen PHQ 2/9  Decreased Interest 0 0 0 0 0  Down, Depressed, Hopeless 0 0 0 0 0  PHQ - 2 Score 0 0 0 0 0  Altered sleeping 0 0 0 0 0  Tired, decreased energy 1 0 1 0 0  Change in appetite 0 0 1 0 0  Feeling bad or failure about yourself  0 0 0 0 0  Trouble concentrating 0 0 0 0 0  Moving slowly or fidgety/restless 0 0 0 0 0  Suicidal thoughts 0 0 0 0 0  PHQ-9 Score 1 0 2 0 0  Difficult doing work/chores Not difficult at all Not difficult at all Not  difficult at all Not difficult at all Not difficult at all     Hypothyroidism  Pt has no clinical changes No change in energy level/ hair or skin/ edema and no tremor Lab Results  Component Value Date   TSH 2.89 07/08/2024   Levothyroxine  25 mcg daily   Obesity Zepbound  7.5 mg weekly  Had some side effects the first 2 weeks (nausea / hard to eat)  Now ok Wants to stay on this dose   Making good choices  Protein  Produce    Cholesterol  Lab Results  Component Value Date   CHOL 124 07/08/2024   CHOL 158 08/08/2023   CHOL 154 08/03/2022   Lab Results  Component Value Date   HDL 38.50 (L) 07/08/2024   HDL 45.20 08/08/2023   HDL 49.60 08/03/2022   Lab Results  Component Value Date   LDLCALC 64 07/08/2024   LDLCALC 88 08/08/2023   LDLCALC 83 08/03/2022   Lab Results  Component Value Date   TRIG 108.0 07/08/2024   TRIG 122.0 08/08/2023   TRIG 110.0 08/03/2022   Lab Results  Component Value Date   CHOLHDL 3  07/08/2024   CHOLHDL 3 08/08/2023   CHOLHDL 3 08/03/2022   No results found for: LDLDIRECT  Lab Results  Component Value Date   NA 138 07/08/2024   K 3.9 07/08/2024   CO2 23 07/08/2024   GLUCOSE 84 07/08/2024   BUN 14 07/08/2024   CREATININE 0.94 07/08/2024   CALCIUM 9.3 07/08/2024   GFR 74.50 07/08/2024   Lab Results  Component Value Date   ALT 16 07/08/2024   AST 15 07/08/2024   ALKPHOS 63 07/08/2024   BILITOT 0.5 07/08/2024     Patient Active Problem List   Diagnosis Date Noted   Ruptured epidermal cyst 06/19/2024   Left hand paresthesia 04/12/2024   Hypothyroid 08/15/2023   Plantar fasciitis 08/09/2022   Elevated transaminase level 09/21/2020   Vitamin D  deficiency 09/07/2020   Obesity (BMI 30-39.9) 09/07/2020   Rosacea 01/06/2015   Routine general medical examination at a health care facility 01/06/2015   Migraine 01/06/2015   Past Medical History:  Diagnosis Date   Migraine    Rosacea    Past Surgical History:  Procedure  Laterality Date   BICEPS TENDON REPAIR  2021   murphy wainer   CESAREAN SECTION  2007   GALLBLADDER SURGERY  2007   Social History   Tobacco Use   Smoking status: Never   Smokeless tobacco: Never  Substance Use Topics   Alcohol use: No    Alcohol/week: 0.0 standard drinks of alcohol   Drug use: No   Family History  Problem Relation Age of Onset   Cancer Maternal Grandmother    Allergies  Allergen Reactions   Sulfa Antibiotics    Penicillins Rash   Current Outpatient Medications on File Prior to Visit  Medication Sig Dispense Refill   ANUSOL-HC 2.5 % rectal cream Place 1 Application rectally 2 (two) times daily.     doxycycline  (VIBRAMYCIN ) 100 MG capsule Take 100 mg by mouth daily.     topiramate (TOPAMAX) 100 MG tablet Take 1 tablet by mouth at bedtime.     TOSYMRA 10 MG/ACT SOLN Place 1 spray into both nostrils daily as needed for migraine.     triamcinolone  cream (KENALOG ) 0.1 % APPLY A TINY AMOUNT TO AFFECTED AREA OF SKIN TWO TIMES A DAY 30 g 0   Current Facility-Administered Medications on File Prior to Visit  Medication Dose Route Frequency Provider Last Rate Last Admin   triamcinolone  acetonide (KENALOG ) 10 MG/ML injection 10 mg  10 mg Other Once Magdalen Pasco RAMAN, DPM        Review of Systems  Constitutional:  Negative for activity change, appetite change, fatigue, fever and unexpected weight change.  HENT:  Negative for congestion, ear pain, rhinorrhea, sinus pressure and sore throat.   Eyes:  Negative for pain, redness and visual disturbance.  Respiratory:  Negative for cough, shortness of breath and wheezing.   Cardiovascular:  Negative for chest pain and palpitations.  Gastrointestinal:  Negative for abdominal pain, blood in stool, constipation and diarrhea.  Endocrine: Negative for polydipsia and polyuria.  Genitourinary:  Negative for dysuria, frequency and urgency.  Musculoskeletal:  Negative for arthralgias, back pain and myalgias.       Plantar  fasciitis-a bit improved  Shoulder problems   Skin:  Negative for pallor and rash.  Allergic/Immunologic: Negative for environmental allergies.  Neurological:  Negative for dizziness, syncope and headaches.  Hematological:  Negative for adenopathy. Does not bruise/bleed easily.  Psychiatric/Behavioral:  Negative for decreased concentration and dysphoric mood. The patient  is not nervous/anxious.        Objective:   Physical Exam Constitutional:      General: She is not in acute distress.    Appearance: Normal appearance. She is well-developed and normal weight. She is not ill-appearing or diaphoretic.  HENT:     Head: Normocephalic and atraumatic.     Right Ear: Tympanic membrane, ear canal and external ear normal.     Left Ear: Tympanic membrane, ear canal and external ear normal.     Nose: Nose normal. No congestion.     Mouth/Throat:     Mouth: Mucous membranes are moist.     Pharynx: Oropharynx is clear. No posterior oropharyngeal erythema.  Eyes:     General: No scleral icterus.    Extraocular Movements: Extraocular movements intact.     Conjunctiva/sclera: Conjunctivae normal.     Pupils: Pupils are equal, round, and reactive to light.  Neck:     Thyroid : No thyromegaly.     Vascular: No carotid bruit or JVD.  Cardiovascular:     Rate and Rhythm: Normal rate and regular rhythm.     Pulses: Normal pulses.     Heart sounds: Normal heart sounds.     No gallop.  Pulmonary:     Effort: Pulmonary effort is normal. No respiratory distress.     Breath sounds: Normal breath sounds. No wheezing.     Comments: Good air exch Chest:     Chest wall: No tenderness.  Abdominal:     General: Bowel sounds are normal. There is no distension or abdominal bruit.     Palpations: Abdomen is soft. There is no mass.     Tenderness: There is no abdominal tenderness.     Hernia: No hernia is present.  Genitourinary:    Comments: Breast and pelvic exam are done by gyn provider    Musculoskeletal:        General: No tenderness. Normal range of motion.     Cervical back: Normal range of motion and neck supple. No rigidity. No muscular tenderness.     Right lower leg: No edema.     Left lower leg: No edema.     Comments: No kyphosis   Lymphadenopathy:     Cervical: No cervical adenopathy.  Skin:    General: Skin is warm and dry.     Coloration: Skin is not pale.     Findings: No erythema or rash.     Comments: Mildly tanned  Few lentigines   Neurological:     Mental Status: She is alert. Mental status is at baseline.     Cranial Nerves: No cranial nerve deficit.     Motor: No abnormal muscle tone.     Coordination: Coordination normal.     Gait: Gait normal.     Deep Tendon Reflexes: Reflexes normal.  Psychiatric:        Mood and Affect: Mood normal.        Cognition and Memory: Cognition and memory normal.           Assessment & Plan:   Problem List Items Addressed This Visit       Endocrine   Hypothyroid   Hypothyroidism  Pt has no clinical changes No change in energy level/ hair or skin/ edema and no tremor Lab Results  Component Value Date   TSH 2.89 07/08/2024    Continues levothyroxine  25 mcg daily      Relevant Medications   levothyroxine  (SYNTHROID ) 25 MCG  tablet     Other   Vitamin D  deficiency   Last vitamin D  Lab Results  Component Value Date   VD25OH 52.82 07/08/2024   Vitamin D  level is therapeutic with current supplementation Disc importance of this to bone and overall health       Routine general medical examination at a health care facility - Primary   Reviewed health habits including diet and exercise and skin cancer prevention Reviewed appropriate screening tests for age  Also reviewed health mt list, fam hx and immunization status , as well as social and family history   See HPI Labs reviewed and ordered Health Maintenance  Topic Date Due   HIV Screening  Never done   Hepatitis B Vaccine (1 of 3 - 19+  3-dose series) Never done   HPV Vaccine (1 - 3-dose SCDM series) Never done   COVID-19 Vaccine (3 - 2025-26 season) 09/01/2026*   Breast Cancer Screening  07/01/2025   DTaP/Tdap/Td vaccine (2 - Td or Tdap) 07/27/2026   Pap with HPV screening  06/27/2028   Flu Shot  Completed   Hepatitis C Screening  Completed   Pneumococcal Vaccine  Aged Out   Meningitis B Vaccine  Aged Out  *Topic was postponed. The date shown is not the original due date.    Flu shot today  Utd gyn care  Will discuss colon cancer screening at 45 Discussed fall prevention, supplements and exercise for bone density  PHQ 1       Obesity (BMI 30-39.9)   Bmi is down to 27.48 now  Lost over 30 lb so far with glp-1  Continues zepbound  7.5 mg weekly  Will stay at this dose as she did initially have nausea   Discussed how this problem influences overall health and the risks it imposes  Reviewed plan for weight loss with lower calorie diet (via better food choices (lower glycemic and portion control) along with exercise building up to or more than 30 minutes 5 days per week including some aerobic activity and strength training   Encouraged strongly to continue strength training        Relevant Medications   tirzepatide  (ZEPBOUND ) 7.5 MG/0.5ML Pen   Elevated transaminase level   Lab Results  Component Value Date   ALT 16 07/08/2024   AST 15 07/08/2024   ALKPHOS 63 07/08/2024   BILITOT 0.5 07/08/2024   Reassuring Improved with weight loss       Other Visit Diagnoses       Need for influenza vaccination       Relevant Orders   Flu vaccine trivalent PF, 6mos and older(Flulaval,Afluria,Fluarix,Fluzone) (Completed)

## 2024-08-16 NOTE — Assessment & Plan Note (Signed)
 Hypothyroidism  Pt has no clinical changes No change in energy level/ hair or skin/ edema and no tremor Lab Results  Component Value Date   TSH 2.89 07/08/2024    Continues levothyroxine  25 mcg daily

## 2024-08-16 NOTE — Assessment & Plan Note (Signed)
 Reviewed health habits including diet and exercise and skin cancer prevention Reviewed appropriate screening tests for age  Also reviewed health mt list, fam hx and immunization status , as well as social and family history   See HPI Labs reviewed and ordered Health Maintenance  Topic Date Due   HIV Screening  Never done   Hepatitis B Vaccine (1 of 3 - 19+ 3-dose series) Never done   HPV Vaccine (1 - 3-dose SCDM series) Never done   COVID-19 Vaccine (3 - 2025-26 season) 09/01/2026*   Breast Cancer Screening  07/01/2025   DTaP/Tdap/Td vaccine (2 - Td or Tdap) 07/27/2026   Pap with HPV screening  06/27/2028   Flu Shot  Completed   Hepatitis C Screening  Completed   Pneumococcal Vaccine  Aged Out   Meningitis B Vaccine  Aged Out  *Topic was postponed. The date shown is not the original due date.    Flu shot today  Utd gyn care  Will discuss colon cancer screening at 45 Discussed fall prevention, supplements and exercise for bone density  PHQ 1

## 2024-09-19 ENCOUNTER — Telehealth: Payer: Self-pay | Admitting: *Deleted

## 2024-09-19 ENCOUNTER — Encounter: Payer: Self-pay | Admitting: Family Medicine

## 2024-09-19 DIAGNOSIS — E669 Obesity, unspecified: Secondary | ICD-10-CM

## 2024-09-19 MED ORDER — TIRZEPATIDE-WEIGHT MANAGEMENT 5 MG/0.5ML ~~LOC~~ SOAJ: 5.0000 mg | SUBCUTANEOUS | 3 refills | Status: DC

## 2024-09-19 NOTE — Telephone Encounter (Signed)
 I sent in the 5 mg  Let us  know if you don't tolerate that

## 2024-09-19 NOTE — Assessment & Plan Note (Signed)
 Changed zepbound  back to 5 mg since 7.5 caused too many side effects

## 2024-09-19 NOTE — Telephone Encounter (Signed)
 Pt sent a message saying:  Good morning  I've been having some side effects from the Zepbound  7.5- including nausea, extreme lack of appetite, hiccups, lack of energy, dizzy spells ( probably from not eating), etc. I didn't take the shot the last 2 weeks just so I could start feeling a little better and start eating more. Hiccups were strange, but they did stop. I didn't have these extreme side effects with the previous dose.    Can my dose my changed back to the previous dose as I was doing well with that with minimal symptoms.

## 2024-09-19 NOTE — Telephone Encounter (Signed)
 Sent mychart letting pt know  ?

## 2024-10-13 ENCOUNTER — Ambulatory Visit
Admission: EM | Admit: 2024-10-13 | Discharge: 2024-10-13 | Disposition: A | Discharge status: Home / Self Care | Attending: Emergency Medicine | Admitting: Emergency Medicine

## 2024-10-13 DIAGNOSIS — H1032 Unspecified acute conjunctivitis, left eye: Secondary | ICD-10-CM | POA: Diagnosis not present

## 2024-10-13 DIAGNOSIS — J069 Acute upper respiratory infection, unspecified: Secondary | ICD-10-CM | POA: Diagnosis not present

## 2024-10-13 MED ORDER — AZITHROMYCIN 250 MG PO TABS: 250.0000 mg | ORAL_TABLET | Freq: Every day | ORAL | 0 refills | Status: DC

## 2024-10-13 MED ORDER — POLYMYXIN B-TRIMETHOPRIM 10000-0.1 UNIT/ML-% OP SOLN: 1.0000 [drp] | Freq: Four times a day (QID) | OPHTHALMIC | 0 refills | Status: AC

## 2024-10-13 NOTE — ED Triage Notes (Signed)
 Patient to Urgent Care with complaints of cough/ chest congestion/ left sided eye drainage (woke up with eye matted shut)/ green nasal congestion.  Symptoms started Tuesday.   Taking mucinex and dayquil.

## 2024-10-13 NOTE — ED Provider Notes (Signed)
 [facility]    CSN: [id number] Arrival date & time: 10/13/24  9148      History   Chief Complaint Chief Complaint  Patient presents with   Cough   Nasal Congestion   Eye Problem    HPI [name] [name] is a 43 y.o. female.  Patient presents with 5-day history of congestion, runny nose, postnasal drip, cough.  She has been treating her symptoms with DayQuil and Mucinex.  She woke up this morning with left eye redness, drainage, crusting and matted.  No fever or shortness of breath.  No eye trauma, eye pain, or change in vision.  The history is provided by the patient and medical records.    Past Medical History:  Diagnosis Date   Migraine    Rosacea     Patient Active Problem List   Diagnosis Date Noted   Ruptured epidermal cyst 06/19/2024   Left hand paresthesia 04/12/2024   Hypothyroid 08/15/2023   Plantar fasciitis 08/09/2022   Elevated transaminase level 09/21/2020   Vitamin D  deficiency 09/07/2020   Obesity (BMI 30-39.9) 09/07/2020   Rosacea 01/06/2015   Routine general medical examination at a health care facility 01/06/2015   Migraine 01/06/2015    Past Surgical History:  Procedure Laterality Date   BICEPS TENDON REPAIR  2021   murphy wainer   CESAREAN SECTION  2007   GALLBLADDER SURGERY  2007    OB History   No obstetric history on file.      Home Medications    Prior to Admission medications   Medication Sig Start Date End Date Taking? Authorizing Provider  azithromycin (ZITHROMAX) 250 MG tablet Take 1 tablet (250 mg total) by mouth daily. Take first 2 tablets together, then 1 every day until finished. 10/13/24  Yes Corlis Burnard DEL, NP  trimethoprim-polymyxin b (POLYTRIM) ophthalmic solution Place 1 drop into both eyes 4 (four) times daily for 7 days. 10/13/24 10/20/24 Yes Corlis Burnard DEL, NP  ANUSOL-HC 2.5 % rectal cream Place 1 Application rectally 2 (two) times daily. 07/01/24   [provider]  levothyroxine  (SYNTHROID ) 25 MCG  tablet Take 1 tablet (25 mcg total) by mouth daily before breakfast. 08/16/24   Tower, Laine LABOR, MD  tirzepatide  (ZEPBOUND ) 5 MG/0.5ML Pen Inject 5 mg into the skin once a week. 09/19/24   Tower, Laine LABOR, MD  topiramate (TOPAMAX) 100 MG tablet Take 1 tablet by mouth at bedtime.    [provider]  TOSYMRA 10 MG/ACT SOLN Place 1 spray into both nostrils daily as needed for migraine. 05/14/20   [provider]  triamcinolone  cream (KENALOG ) 0.1 % APPLY A TINY AMOUNT TO AFFECTED AREA OF SKIN TWO TIMES A DAY 05/20/24   Tower, Laine LABOR, MD    Family History Family History  Problem Relation Age of Onset   Cancer Maternal Grandmother     Social History Social History   Tobacco Use   Smoking status: Never   Smokeless tobacco: Never  Substance Use Topics   Alcohol use: No    Alcohol/week: 0.0 standard drinks of alcohol   Drug use: No     Allergies   Sulfa antibiotics and Penicillins   Review of Systems Review of Systems  Constitutional:  Negative for chills and fever.  HENT:  Positive for congestion, postnasal drip and rhinorrhea. Negative for ear pain and sore throat.   Eyes:  Positive for discharge, redness and itching. Negative for pain and visual disturbance.  Respiratory:  Positive for cough.  Negative for shortness of breath.      Physical Exam Triage Vital Signs ED Triage Vitals  Encounter Vitals Group     BP      Girls Systolic BP Percentile      Girls Diastolic BP Percentile      Boys Systolic BP Percentile      Boys Diastolic BP Percentile      Pulse      Resp      Temp      Temp src      SpO2      Weight      Height      Head Circumference      Peak Flow      Pain Score      Pain Loc      Pain Education      Exclude from Growth Chart    No data found.  Updated Vital Signs BP 128/86   Pulse 96   Temp 98 F (36.7 C)   Resp 18   SpO2 98%   Visual Acuity Right Eye Distance: 20/20 Left Eye Distance: 20/20 Bilateral  Distance: 20/16  Right Eye Near:   Left Eye Near:    Bilateral Near:     Physical Exam Constitutional:      General: She is not in acute distress. HENT:     Right Ear: Tympanic membrane normal.     Left Ear: Tympanic membrane normal.     Nose: Congestion and rhinorrhea present.     Mouth/Throat:     Mouth: Mucous membranes are moist.     Pharynx: Oropharynx is clear.  Eyes:     General: Lids are normal. Vision grossly intact.     Extraocular Movements: Extraocular movements intact.     Conjunctiva/sclera:     Left eye: Left conjunctiva is injected.     Pupils: Pupils are equal, round, and reactive to light.  Cardiovascular:     Rate and Rhythm: Normal rate and regular rhythm.     Heart sounds: Normal heart sounds.  Pulmonary:     Effort: Pulmonary effort is normal. No respiratory distress.     Breath sounds: Normal breath sounds.  Neurological:     Mental Status: She is alert.      UC Treatments / Results  Labs (all labs ordered are listed, but only abnormal results are displayed) Labs Reviewed - No data to display  EKG   Radiology No results found.  Procedures Procedures (including critical care time)  Medications Ordered in UC Medications - No data to display  Initial Impression / Assessment and Plan / UC Course  I have reviewed the triage vital signs and the nursing notes.  Pertinent labs & imaging results that were available during my care of the patient were reviewed by me and considered in my medical decision making (see chart for details).    Acute upper respiratory infection, bacterial conjunctivitis of left eye.  Afebrile and vital signs are stable.  Lungs are clear and O2 sat is 98% on room air.  Patient is allergic to penicillin and sulfa.  Treating today with Zithromax and Polytrim eyedrops.  Education provided on URI and conjunctivitis.  Instructed patient to follow-up with her PCP if she is not improving.  ED precautions given.  She agrees to  plan of care.  Final Clinical Impressions(s) / UC Diagnoses   Final diagnoses:  Acute upper respiratory infection  Acute bacterial conjunctivitis of left eye  Discharge Instructions      Take the Zithromax as directed.  Use the antibiotic eyedrops as directed.  Follow-up with your primary care provider if you are not improving.  Go to the emergency department if you have worsening symptoms.     ED Prescriptions     Medication Sig Dispense Auth. Provider   azithromycin (ZITHROMAX) 250 MG tablet Take 1 tablet (250 mg total) by mouth daily. Take first 2 tablets together, then 1 every day until finished. 6 tablet Corlis Burnard DEL, NP   trimethoprim-polymyxin b (POLYTRIM) ophthalmic solution Place 1 drop into both eyes 4 (four) times daily for 7 days. 10 mL Corlis Burnard DEL, NP      PDMP not reviewed this encounter.   Corlis Burnard DEL, NP 10/13/24 [phone]

## 2024-10-13 NOTE — Discharge Instructions (Addendum)
 Take the Zithromax as directed.  Use the antibiotic eyedrops as directed.  Follow-up with your primary care provider if you are not improving.  Go to the emergency department if you have worsening symptoms.

## 2024-10-15 ENCOUNTER — Encounter: Payer: Self-pay | Admitting: Family Medicine

## 2024-10-16 NOTE — Telephone Encounter (Signed)
 Where can I put pt theres no opening slots 11/25 pt need to be seen sooner

## 2024-10-16 NOTE — Telephone Encounter (Signed)
 [facility] has appts tomorrow, or anyone else that has opening, even if pt has to be seen at another office, 1st available is fine

## 2024-10-16 NOTE — Telephone Encounter (Signed)
 Schedule pt with [facility] for tomorrow

## 2024-10-17 ENCOUNTER — Ambulatory Visit: Admitting: Family Medicine

## 2024-10-17 ENCOUNTER — Encounter: Payer: Self-pay | Admitting: Family Medicine

## 2024-10-17 VITALS — BP 118/80 | HR 95 | Temp 97.3°F | Ht 67.75 in | Wt 165.0 lb

## 2024-10-17 DIAGNOSIS — R051 Acute cough: Secondary | ICD-10-CM | POA: Diagnosis not present

## 2024-10-17 MED ORDER — PREDNISONE 10 MG PO TABS: ORAL_TABLET | ORAL | 0 refills | Status: AC

## 2024-10-17 MED ORDER — GUAIFENESIN-CODEINE 100-10 MG/5ML PO SOLN: 5.0000 mL | Freq: Every evening | ORAL | 0 refills | Status: AC | PRN

## 2024-10-17 NOTE — Progress Notes (Signed)
 Patient ID: [name] [name], female    DOB: 05/29/1981, 43 y.o.   MRN: [id number]  This visit was conducted in person.  BP 118/80   Pulse 95   Temp (!) 97.3 F (36.3 C) (Temporal)   Ht 5' 7.75 (1.721 m)   Wt 165 lb (74.8 kg)   SpO2 99%   BMI 25.27 kg/m    CC:  Chief Complaint  Patient presents with  . Follow-up    Urgent Care Follow Up 10/13/24 for URI/Conjunctivitis CoughSOB has gotten worse Finished Z-pak today    Subjective:   HPI: [name] [name] is a 43 y.o. female presenting on 10/17/2024 for Follow-up (Urgent Care Follow Up 10/13/24 for URI/Conjunctivitis/CoughSOB has gotten worse/Finished Z-pak today)   Recent urgent care office visit on November 16 for upper respiratory tract infection and bacterial conjunctivitis. Initial symptoms included congestion, runny nose, postnasal drip and cough.  She initially treated with DayQuil and Mucinex.  She was treated with azithromycin course and antibiotic eyedrops. Today she presents given she has had worsening cough and shortness of breath.  On day 9 of illness.  Redness and discharge in eye has improve.  She reports cough is constant... cannot say more than a few words without coughing.  Dry, tickly cough. When coughing and talking feel SOB.SABRA no SOB  with exertion.  At night cannot sleep with coughing worse with lying flat.  Chest heaviness.  No wheeze.  No fever.  No face pain or ear pain.  Physically feeling better.   She drinks lots of water   Nonsmoker  No history of chronic lung disease.Not immunocompromised.       Relevant past medical, surgical, family and social history reviewed and updated as indicated. Interim medical history since our last visit reviewed. Allergies and medications reviewed and updated. Outpatient Medications Prior to Visit  Medication Sig Dispense Refill  . ANUSOL-HC 2.5 % rectal cream Place 1 Application rectally 2 (two) times daily.    . levothyroxine  (SYNTHROID ) 25 MCG tablet  Take 1 tablet (25 mcg total) by mouth daily before breakfast. 90 tablet 3  . tirzepatide  (ZEPBOUND ) 5 MG/0.5ML Pen Inject 5 mg into the skin once a week. 2 mL 3  . topiramate (TOPAMAX) 100 MG tablet Take 1 tablet by mouth at bedtime.    . TOSYMRA 10 MG/ACT SOLN Place 1 spray into both nostrils daily as needed for migraine.    . triamcinolone  cream (KENALOG ) 0.1 % APPLY A TINY AMOUNT TO AFFECTED AREA OF SKIN TWO TIMES A DAY 30 g 0  . trimethoprim-polymyxin b (POLYTRIM) ophthalmic solution Place 1 drop into both eyes 4 (four) times daily for 7 days. 10 mL 0  . azithromycin (ZITHROMAX) 250 MG tablet Take 1 tablet (250 mg total) by mouth daily. Take first 2 tablets together, then 1 every day until finished. 6 tablet 0   Facility-Administered Medications Prior to Visit  Medication Dose Route Frequency Provider Last Rate Last Admin  . triamcinolone  acetonide (KENALOG ) 10 MG/ML injection 10 mg  10 mg Other Once Magdalen Pasco RAMAN, DPM         Per HPI unless specifically indicated in ROS section below Review of Systems Objective:  BP 118/80   Pulse 95   Temp (!) 97.3 F (36.3 C) (Temporal)   Ht 5' 7.75 (1.721 m)   Wt 165 lb (74.8 kg)   SpO2 99%   BMI 25.27 kg/m   Wt Readings from Last 3 Encounters:  10/17/24 165 lb (  74.8 kg)  08/16/24 179 lb 6 oz (81.4 kg)  07/08/24 193 lb (87.5 kg)      Physical Exam    Results for orders placed or performed in visit on 08/01/24  [facility] MAMMOGRAPHY   Collection Time: 07/01/24  9:26 AM  Result Value Ref Range   HM Mammogram 0-4 Bi-Rad 0-4 Bi-Rad, Self Reported Normal    Assessment and Plan  There are no diagnoses linked to this encounter.  No follow-ups on file.   Greig Ring, MD

## 2024-10-30 ENCOUNTER — Other Ambulatory Visit (HOSPITAL_COMMUNITY): Payer: Self-pay

## 2024-10-30 ENCOUNTER — Telehealth: Payer: Self-pay

## 2024-10-30 NOTE — Telephone Encounter (Signed)
 Pharmacy Patient Advocate Encounter  Received notification from [facility] that Prior Authorization for Zepbound  5 has been APPROVED from 10/30/24 to 10/30/25. Unable to obtain price due to refill too soon rejection, last fill date 10/16/24 next available fill date12/10/25   PA #/Case ID/Reference #: # [id number]

## 2024-10-30 NOTE — Telephone Encounter (Signed)
 Pharmacy Patient Advocate Encounter   Received notification from Onbase that prior authorization for Zepbound  is required/requested.   Insurance verification completed.   The patient is insured through [facility]   Per test claim: PA required; PA submitted to above mentioned insurance via Latent Key/confirmation #/EOC [id number] Status is pending

## 2024-12-24 ENCOUNTER — Other Ambulatory Visit (HOSPITAL_COMMUNITY): Payer: Self-pay

## 2024-12-30 ENCOUNTER — Telehealth: Payer: Self-pay | Admitting: Pharmacy Technician

## 2024-12-30 ENCOUNTER — Other Ambulatory Visit (HOSPITAL_COMMUNITY): Payer: Self-pay

## 2024-12-31 ENCOUNTER — Other Ambulatory Visit (HOSPITAL_COMMUNITY): Payer: Self-pay

## 2024-12-31 NOTE — Telephone Encounter (Signed)
 Pharmacy Patient Advocate Encounter  Received notification from [facility] that Prior Authorization for Zepbound  5MG /0.5ML pen-injectors  has been APPROVED from 12/30/2024 to 12/30/2025. Ran test claim, Copay is $60.00. This test claim was processed through [facility]- copay amounts may vary at other pharmacies due to pharmacy/plan contracts, or as the patient moves through the different stages of their insurance plan.   PA #/Case ID/Reference #: [id number]

## 2025-01-02 ENCOUNTER — Other Ambulatory Visit: Payer: Self-pay | Admitting: *Deleted

## 2025-01-02 MED ORDER — LEVOTHYROXINE SODIUM 25 MCG PO TABS: 25.0000 ug | ORAL_TABLET | Freq: Every day | ORAL | 2 refills | Status: AC

## 2025-01-02 MED ORDER — TIRZEPATIDE-WEIGHT MANAGEMENT 5 MG/0.5ML ~~LOC~~ SOAJ: 5.0000 mg | SUBCUTANEOUS | 1 refills | Status: AC

## 2025-01-02 NOTE — Telephone Encounter (Signed)
 Received fax requesting Rx to be sent to [facility]   CPE was on 08/16/24  Zepbound  last filled on 09/19/24 #2 mL/ 3 refills   Synthroid  was filled for a year at [facility] but was sent to local pharmacy
